# Patient Record
Sex: Female | Born: 1945 | Race: White | Hispanic: No | Marital: Married | Smoking: Former smoker
Health system: Southern US, Community
[De-identification: ages and names within clinical notes are randomized; demographics above are authoritative.]

## PROBLEM LIST (undated history)

## (undated) DIAGNOSIS — I10 Essential (primary) hypertension: Secondary | ICD-10-CM

## (undated) DIAGNOSIS — I8390 Asymptomatic varicose veins of unspecified lower extremity: Secondary | ICD-10-CM

## (undated) DIAGNOSIS — E785 Hyperlipidemia, unspecified: Secondary | ICD-10-CM

## (undated) DIAGNOSIS — T7840XA Allergy, unspecified, initial encounter: Secondary | ICD-10-CM

## (undated) HISTORY — DX: Asymptomatic varicose veins of unspecified lower extremity: I83.90

## (undated) HISTORY — PX: TONSILLECTOMY: SUR1361

## (undated) HISTORY — DX: Allergy, unspecified, initial encounter: T78.40XA

## (undated) HISTORY — DX: Hyperlipidemia, unspecified: E78.5

## (undated) HISTORY — PX: PITUITARY SURGERY: SHX203

## (undated) HISTORY — PX: JOINT REPLACEMENT: SHX530

## (undated) HISTORY — PX: VARICOSE VEIN SURGERY: SHX832

---

## 1998-02-05 ENCOUNTER — Other Ambulatory Visit: Admission: RE | Admit: 1998-02-05 | Discharge: 1998-02-05 | Payer: Self-pay | Admitting: *Deleted

## 2000-09-27 ENCOUNTER — Ambulatory Visit (HOSPITAL_COMMUNITY): Admission: RE | Admit: 2000-09-27 | Discharge: 2000-09-27 | Payer: Self-pay | Admitting: Internal Medicine

## 2000-09-27 ENCOUNTER — Encounter: Payer: Self-pay | Admitting: Internal Medicine

## 2000-10-22 ENCOUNTER — Other Ambulatory Visit: Admission: RE | Admit: 2000-10-22 | Discharge: 2000-10-22 | Payer: Self-pay | Admitting: Urology

## 2002-11-04 ENCOUNTER — Encounter: Payer: Self-pay | Admitting: Internal Medicine

## 2002-11-04 ENCOUNTER — Ambulatory Visit (HOSPITAL_COMMUNITY): Admission: RE | Admit: 2002-11-04 | Discharge: 2002-11-04 | Payer: Self-pay | Admitting: Internal Medicine

## 2004-03-16 ENCOUNTER — Ambulatory Visit (HOSPITAL_COMMUNITY): Admission: RE | Admit: 2004-03-16 | Discharge: 2004-03-16 | Payer: Self-pay | Admitting: Internal Medicine

## 2004-11-15 ENCOUNTER — Ambulatory Visit (HOSPITAL_COMMUNITY): Admission: RE | Admit: 2004-11-15 | Discharge: 2004-11-15 | Payer: Self-pay | Admitting: Internal Medicine

## 2005-08-09 ENCOUNTER — Ambulatory Visit (HOSPITAL_COMMUNITY): Admission: RE | Admit: 2005-08-09 | Discharge: 2005-08-09 | Payer: Self-pay

## 2006-04-24 ENCOUNTER — Encounter: Admission: RE | Admit: 2006-04-24 | Discharge: 2006-04-24 | Payer: Self-pay | Admitting: Internal Medicine

## 2006-11-07 ENCOUNTER — Encounter: Admission: RE | Admit: 2006-11-07 | Discharge: 2006-11-07 | Payer: Self-pay | Admitting: Internal Medicine

## 2007-05-03 ENCOUNTER — Encounter: Admission: RE | Admit: 2007-05-03 | Discharge: 2007-05-03 | Payer: Self-pay | Admitting: Emergency Medicine

## 2008-05-08 ENCOUNTER — Encounter: Admission: RE | Admit: 2008-05-08 | Discharge: 2008-05-08 | Payer: Self-pay | Admitting: Emergency Medicine

## 2009-05-18 ENCOUNTER — Encounter: Admission: RE | Admit: 2009-05-18 | Discharge: 2009-05-18 | Payer: Self-pay

## 2010-04-10 ENCOUNTER — Encounter: Payer: Self-pay | Admitting: Internal Medicine

## 2010-04-22 ENCOUNTER — Other Ambulatory Visit: Payer: Self-pay | Admitting: *Deleted

## 2010-04-22 DIAGNOSIS — Z1231 Encounter for screening mammogram for malignant neoplasm of breast: Secondary | ICD-10-CM

## 2010-05-05 ENCOUNTER — Other Ambulatory Visit (HOSPITAL_COMMUNITY): Payer: Self-pay | Admitting: Internal Medicine

## 2010-05-06 ENCOUNTER — Encounter (HOSPITAL_COMMUNITY): Payer: Self-pay

## 2010-05-06 ENCOUNTER — Ambulatory Visit (HOSPITAL_COMMUNITY)
Admission: RE | Admit: 2010-05-06 | Discharge: 2010-05-06 | Disposition: A | Discharge status: Home / Self Care | Payer: BC Managed Care – PPO | Source: Ambulatory Visit | Attending: Internal Medicine | Admitting: Internal Medicine

## 2010-05-06 DIAGNOSIS — R079 Chest pain, unspecified: Secondary | ICD-10-CM | POA: Insufficient documentation

## 2010-05-06 DIAGNOSIS — R0789 Other chest pain: Secondary | ICD-10-CM | POA: Insufficient documentation

## 2010-05-06 HISTORY — DX: Essential (primary) hypertension: I10

## 2010-05-06 MED ORDER — TECHNETIUM TC 99M TETROFOSMIN IV KIT: 20.0000 | PACK | Freq: Once | INTRAVENOUS | Status: DC | PRN

## 2010-05-06 MED ORDER — TECHNETIUM TC 99M TETROFOSMIN IV KIT
30.0000 | PACK | Freq: Once | INTRAVENOUS | Status: AC | PRN
Start: 2010-05-06 — End: 2010-05-06

## 2010-05-09 ENCOUNTER — Ambulatory Visit (HOSPITAL_COMMUNITY): Admission: RE | Admit: 2010-05-09 | Payer: Self-pay | Source: Ambulatory Visit

## 2010-05-10 MED ORDER — TECHNETIUM TC 99M TETROFOSMIN IV KIT
20.0000 | PACK | Freq: Once | INTRAVENOUS | Status: AC | PRN
  Administered 2010-05-10: 19.11 via INTRAVENOUS

## 2010-05-10 MED ORDER — TECHNETIUM TC 99M TETROFOSMIN IV KIT: 30.0000 | PACK | Freq: Once | INTRAVENOUS | Status: AC | PRN

## 2010-05-23 ENCOUNTER — Ambulatory Visit
Admission: RE | Admit: 2010-05-23 | Discharge: 2010-05-23 | Disposition: A | Discharge status: Home / Self Care | Payer: BC Managed Care – PPO | Source: Ambulatory Visit | Attending: *Deleted | Admitting: *Deleted

## 2010-05-23 ENCOUNTER — Other Ambulatory Visit: Payer: Self-pay | Admitting: Internal Medicine

## 2010-05-23 DIAGNOSIS — Z1231 Encounter for screening mammogram for malignant neoplasm of breast: Secondary | ICD-10-CM

## 2010-06-10 NOTE — Progress Notes (Signed)
  NAMESHAWNEEQUA, BALDRIDGE            ACCOUNT NO.:  [id number]  MEDICAL RECORD NO.:  [id number]           PATIENT TYPE:  LOCATION:                                 FACILITY:  PHYSICIAN:  Kingsley Callander. Ouida Sills, MD       DATE OF BIRTH:  [birth year]  DATE OF PROCEDURE: DATE OF DISCHARGE:                                PROGRESS NOTE   The patient underwent a Myoview stress test for evaluation of recent symptoms of chest pain.  She exercised 11 minutes and 13 seconds (2 minutes and 13 seconds in stage IV of the Bruce protocol) attaining a maximal heart rate of 175 (111% of the age predicted maximal heart rate), and a workload of 12.8 mets and discontinued exercise due to fatigue.  There were no symptoms of chest pain.  There were no arrhythmias.  There were no ST-segment changes diagnostic of ischemia. Her exercise tolerance was good.  Her baseline EKG revealed normal sinus rhythm at 71 beats per minute with nonspecific T-wave changes.  IMPRESSION:  No evidence of exercise-induced ischemia.  Myoview images are pending.     Kingsley Callander. Ouida Sills, MD     ROF/MEDQ  D:  05/06/2010  T:  05/06/2010  Job:  [id number]  Electronically Signed by Carylon Perches MD on 05/22/2010 11:24:08 PM

## 2010-06-30 ENCOUNTER — Encounter (HOSPITAL_BASED_OUTPATIENT_CLINIC_OR_DEPARTMENT_OTHER): Payer: BC Managed Care – PPO | Admitting: Internal Medicine

## 2010-06-30 ENCOUNTER — Ambulatory Visit (HOSPITAL_COMMUNITY)
Admission: RE | Admit: 2010-06-30 | Discharge: 2010-06-30 | Disposition: A | Discharge status: Home / Self Care | Payer: BC Managed Care – PPO | Source: Ambulatory Visit | Attending: Internal Medicine | Admitting: Internal Medicine

## 2010-06-30 ENCOUNTER — Ambulatory Visit (HOSPITAL_COMMUNITY): Payer: BC Managed Care – PPO

## 2010-06-30 DIAGNOSIS — Z5309 Procedure and treatment not carried out because of other contraindication: Secondary | ICD-10-CM | POA: Insufficient documentation

## 2010-06-30 DIAGNOSIS — Z1211 Encounter for screening for malignant neoplasm of colon: Secondary | ICD-10-CM

## 2010-06-30 DIAGNOSIS — E785 Hyperlipidemia, unspecified: Secondary | ICD-10-CM | POA: Insufficient documentation

## 2010-06-30 DIAGNOSIS — K6389 Other specified diseases of intestine: Secondary | ICD-10-CM | POA: Insufficient documentation

## 2010-06-30 DIAGNOSIS — Z79899 Other long term (current) drug therapy: Secondary | ICD-10-CM | POA: Insufficient documentation

## 2010-06-30 DIAGNOSIS — Q438 Other specified congenital malformations of intestine: Secondary | ICD-10-CM

## 2010-06-30 DIAGNOSIS — I10 Essential (primary) hypertension: Secondary | ICD-10-CM | POA: Insufficient documentation

## 2010-06-30 DIAGNOSIS — Z7982 Long term (current) use of aspirin: Secondary | ICD-10-CM | POA: Insufficient documentation

## 2010-07-01 ENCOUNTER — Ambulatory Visit (HOSPITAL_COMMUNITY): Payer: BC Managed Care – PPO

## 2010-07-15 ENCOUNTER — Other Ambulatory Visit (HOSPITAL_COMMUNITY): Payer: BC Managed Care – PPO

## 2010-07-25 NOTE — Op Note (Signed)
  Kristen Orr, Kristen Orr            ACCOUNT NO.:  [id number]  MEDICAL RECORD NO.:  [id number]           PATIENT TYPE:  O  LOCATION:  [facility]                          FACILITY:  [facility]  PHYSICIAN:  Lionel December, M.D.    DATE OF BIRTH:  02/22/1946  DATE OF PROCEDURE:  06/30/2010 DATE OF DISCHARGE:                              OPERATIVE REPORT   PROCEDURE:  Colonoscopy which was incomplete.  INDICATION:  The patient is a 65 year old Caucasian female who is undergoing average risk screening colonoscopy.  Her last exam was over 10 years ago.  Procedure risks were reviewed with the patient and informed consent was obtained.  MEDICATIONS FOR CONSCIOUS SEDATION: 1. Demerol 50 mg IV. 2. Versed 8 mg IV in divided dose.  FINDINGS:  Procedure performed in endoscopy suite.  The patient's vital signs and O2 sats were monitored during the procedure and remained stable.  The patient was placed in left lateral recumbent position and rectal examination performed.  No abnormality noted on external or digital exam.  Pentax videoscope was placed through rectum and advanced under vision into sigmoid colon.  In the sigmoid colon, she made either an 8 or a double loop.  Both of these loops to be palpated.  One of these loops in the left lower quadrant was completely reduced, but the one in the hypogastric area could not be completely reduced or prevented.  Using a loop, I was able to pass the scope into the mid transverse colon, but still had difficulty keeping the scope straight. Procedure was repeated by withdrawing the tip of the scope in the rectum and started again without any success.  Therefore, procedure was not complete.  While in the rectum, scope was retroflexed to examine anorectal junction and small hemorrhoids noted below the dentate line. The patient tolerated the procedure well.  FINAL DIAGNOSIS:  Incomplete colonoscopy limited midtransverse colon secondary to a double loop or 8 loop  formation in the sigmoid colon.  PLAN:  We will proceed with barium enema to complete her evaluation of lower GI tract.  Future colonoscopy would be under fluoroscopy.     Lionel December, M.D.     NR/MEDQ  D:  06/30/2010  T:  07/01/2010  Job:  [id number]  cc:   Kingsley Callander. Ouida Sills, MD Fax: [phone]  Electronically Signed by Lionel December M.D. on 07/25/2010 12:06:52 PM

## 2010-10-04 ENCOUNTER — Other Ambulatory Visit: Payer: Self-pay | Admitting: *Deleted

## 2010-10-04 DIAGNOSIS — M8580 Other specified disorders of bone density and structure, unspecified site: Secondary | ICD-10-CM

## 2010-10-17 ENCOUNTER — Ambulatory Visit
Admission: RE | Admit: 2010-10-17 | Discharge: 2010-10-17 | Disposition: A | Discharge status: Home / Self Care | Payer: BC Managed Care – PPO | Source: Ambulatory Visit | Attending: *Deleted | Admitting: *Deleted

## 2010-10-17 DIAGNOSIS — M8580 Other specified disorders of bone density and structure, unspecified site: Secondary | ICD-10-CM

## 2011-06-08 ENCOUNTER — Other Ambulatory Visit: Payer: Self-pay | Admitting: Internal Medicine

## 2011-06-08 DIAGNOSIS — Z1231 Encounter for screening mammogram for malignant neoplasm of breast: Secondary | ICD-10-CM

## 2011-06-22 ENCOUNTER — Ambulatory Visit
Admission: RE | Admit: 2011-06-22 | Discharge: 2011-06-22 | Disposition: A | Discharge status: Home / Self Care | Payer: BC Managed Care – PPO | Source: Ambulatory Visit | Attending: Internal Medicine | Admitting: Internal Medicine

## 2011-06-22 DIAGNOSIS — Z1231 Encounter for screening mammogram for malignant neoplasm of breast: Secondary | ICD-10-CM

## 2011-11-14 DIAGNOSIS — Z79890 Hormone replacement therapy (postmenopausal): Secondary | ICD-10-CM | POA: Insufficient documentation

## 2011-11-14 DIAGNOSIS — M8580 Other specified disorders of bone density and structure, unspecified site: Secondary | ICD-10-CM | POA: Insufficient documentation

## 2012-03-19 ENCOUNTER — Other Ambulatory Visit: Payer: Self-pay | Admitting: *Deleted

## 2012-03-19 DIAGNOSIS — I83893 Varicose veins of bilateral lower extremities with other complications: Secondary | ICD-10-CM

## 2012-04-26 ENCOUNTER — Encounter: Payer: Self-pay | Admitting: Vascular Surgery

## 2012-04-29 ENCOUNTER — Ambulatory Visit (INDEPENDENT_AMBULATORY_CARE_PROVIDER_SITE_OTHER): Payer: BC Managed Care – PPO | Admitting: Vascular Surgery

## 2012-04-29 ENCOUNTER — Encounter: Payer: Self-pay | Admitting: Vascular Surgery

## 2012-04-29 VITALS — BP 125/79 | HR 85 | Resp 18 | Ht 65.0 in | Wt 135.0 lb

## 2012-04-29 DIAGNOSIS — I83893 Varicose veins of bilateral lower extremities with other complications: Secondary | ICD-10-CM

## 2012-04-29 DIAGNOSIS — M79609 Pain in unspecified limb: Secondary | ICD-10-CM

## 2012-04-29 NOTE — Progress Notes (Signed)
Subjective:     Patient ID: Kristen Orr, female   DOB: [birth year], 67 y.o.   MRN: [id number]  HPIthis 67 year old female was referred by Dr. Ouida Sills for evaluation of varicose veins in both lower extremities. Patient has been treated previously in [place] by Dr. Earnestine Leys no longer practices there. The patient had laser ablation of bilateral great saphenous veins about 1-2 years ago and sclerotherapy. She had a good early results but has had recurrent aching throbbing and burning discomfort in both calves as the day progresses. She has cramping in her legs at night a different sensation during the daytime which is worse while she is standing or sitting. She does wear short-leg impression stockings which improve her symptoms. She has noticed some bluish discoloration in the feet as the day progresses. She has no history of DVT, thrombophlebitis, pulmonary embolus, stasis ulcers, or bleeding.  Past Medical History  Diagnosis Date  . Hypertension   . Varicose veins   . Hyperlipidemia     History  Substance Use Topics  . Smoking status: Former Smoker    Quit date: 04/29/1976  . Smokeless tobacco: Never Used  . Alcohol Use: Yes    Family History  Problem Relation Age of Onset  . Cancer Mother   . Hyperlipidemia Mother   . Hypertension Mother   . Cancer Father   . Deep vein thrombosis Sister     Allergies  Allergen Reactions  . Penicillins     Current outpatient prescriptions:citalopram (CELEXA) 20 MG tablet, Take 20 mg by mouth daily., Disp: , Rfl: ;  clonazePAM (KLONOPIN) 0.5 MG tablet, Take 0.5 mg by mouth daily., Disp: , Rfl: ;  estrogen, conjugated,-medroxyprogesterone (PREMPRO) 0.3-1.5 MG per tablet, Take 1 tablet by mouth 2 (two) times a week., Disp: , Rfl: ;  Fexofenadine HCl (ALLEGRA PO), Take by mouth., Disp: , Rfl:  Omeprazole (PRILOSEC PO), Take by mouth daily., Disp: , Rfl: ;  simvastatin (ZOCOR) 20 MG tablet, Take 20 mg by mouth every evening., Disp: , Rfl: ;   telmisartan-hydrochlorothiazide (MICARDIS HCT) 40-12.5 MG per tablet, Take 1 tablet by mouth daily., Disp: , Rfl:   BP 125/79  Pulse 85  Resp 18  Ht 5\' 5"  (1.651 m)  Wt 135 lb (61.236 kg)  BMI 22.47 kg/m2  Body mass index is 22.47 kg/(m^2).         Review of Systemsdenies chest pain, dyspnea on exertion, PND, orthopnea, hemoptysis, of claudication. All systems negative     Objective:   Physical Exam blood pressure 120/79 heart rate 85 respirations 18 Gen.-alert and oriented x3 in no apparent distress HEENT normal for age Lungs no rhonchi or wheezing Cardiovascular regular rhythm no murmurs carotid pulses 3+ palpable no bruits audible Abdomen soft nontender no palpable masses Musculoskeletal free of  major deformities Skin clear -no rashes Neurologic normal Lower extremities 3+ femoral and dorsalis pedis pulses palpable bilaterally with no edema Left leg with prominent reticular veins in one thrombosed varicose vein in the popliteal fossa and proximal calf with no distal edema, hyperpigmentation, or ulceration. Right leg has some prominent spider and reticular veins in the distal thigh posteriorly and laterally with no hyperpigmentation or ulceration or bulging varicosities noted.  Today I ordered bilateral venous duplex exam which I reviewed and interpreted. Both great saphenous veins have been totally ablated. There is some reflux in the small saphenous veins bilaterally with the veins being small caliber. There is deep venous reflux bilaterally.  Assessment:     Bilateral venous insufficiency with deep venous reflux-post laser ablation bilateral great saphenous veins Prominent reticular veins left posterior calf and popliteal fossa-possibly symptomatic     Plan:     #1 short leg elastic compression stockings 20-30 mm gradient #2 elevate legs at night #3 Will offer possible sclerotherapy of these particular and spider veins and left calf if patient is  interested-this may relieve some of her symptomatology because of the location

## 2012-05-28 ENCOUNTER — Encounter: Payer: Self-pay | Admitting: *Deleted

## 2012-05-29 ENCOUNTER — Ambulatory Visit (INDEPENDENT_AMBULATORY_CARE_PROVIDER_SITE_OTHER): Payer: BC Managed Care – PPO | Admitting: *Deleted

## 2012-05-29 DIAGNOSIS — I781 Nevus, non-neoplastic: Secondary | ICD-10-CM

## 2012-05-29 NOTE — Progress Notes (Signed)
X=.3% Sotradecol administered with a 27g butterfly.  Patient received a total of 12cc.  Reticulars treated first. Easy access. tol well. Hoping for good result. Treated other smaller spiders too. Will follow this nice lady prn.  Photos: yes  Compression stockings applied: yes

## 2012-05-30 ENCOUNTER — Encounter: Payer: Self-pay | Admitting: Vascular Surgery

## 2012-12-09 DIAGNOSIS — I789 Disease of capillaries, unspecified: Secondary | ICD-10-CM | POA: Diagnosis not present

## 2012-12-09 DIAGNOSIS — L821 Other seborrheic keratosis: Secondary | ICD-10-CM | POA: Diagnosis not present

## 2012-12-31 DIAGNOSIS — E785 Hyperlipidemia, unspecified: Secondary | ICD-10-CM | POA: Diagnosis not present

## 2013-01-07 DIAGNOSIS — N390 Urinary tract infection, site not specified: Secondary | ICD-10-CM | POA: Diagnosis not present

## 2013-01-07 DIAGNOSIS — E785 Hyperlipidemia, unspecified: Secondary | ICD-10-CM | POA: Diagnosis not present

## 2013-01-07 DIAGNOSIS — I10 Essential (primary) hypertension: Secondary | ICD-10-CM | POA: Diagnosis not present

## 2013-01-07 DIAGNOSIS — Z23 Encounter for immunization: Secondary | ICD-10-CM | POA: Diagnosis not present

## 2013-02-10 DIAGNOSIS — Z1211 Encounter for screening for malignant neoplasm of colon: Secondary | ICD-10-CM | POA: Diagnosis not present

## 2013-02-10 DIAGNOSIS — Z124 Encounter for screening for malignant neoplasm of cervix: Secondary | ICD-10-CM | POA: Diagnosis not present

## 2013-02-10 DIAGNOSIS — Z01419 Encounter for gynecological examination (general) (routine) without abnormal findings: Secondary | ICD-10-CM | POA: Diagnosis not present

## 2013-02-24 ENCOUNTER — Telehealth: Payer: Self-pay | Admitting: Vascular Surgery

## 2013-02-24 NOTE — Telephone Encounter (Addendum)
Message copied by Fredrich Birks on Mon Feb 24, 2013  1:46 PM ------      Message from: Micki Riley      Created: Mon Feb 24, 2013  8:32 AM      Regarding: RE: JDL visit?       Ok..I talked to her. She will need a study of her left leg before seeing JDL. I told her you would look for a time for the study and JDL together. This doesn't sound like and emergency btw. Thx!      ----- Message -----         From: Fredrich Birks         Sent: 02/21/2013   1:35 PM           To: Micki Riley, RN      Subject: JDL visit?                                               Kristen Orr, Kristen Orr called to make an appointment for a VV issue in her left leg. She was here in February 2014 for venous reflux and consult. She ended up having sclero and was prescribed the compression stockings. I just scheduled her to see JDL in follow up for 03/18/13. Does she need repeat study on the left leg?            Thanks,      Annabelle Harman       ------  02/24/13: spoke with pt to schedule time for lab before seeing JDL, dpm

## 2013-03-12 ENCOUNTER — Other Ambulatory Visit: Payer: Self-pay | Admitting: *Deleted

## 2013-03-12 DIAGNOSIS — I83893 Varicose veins of bilateral lower extremities with other complications: Secondary | ICD-10-CM

## 2013-03-17 ENCOUNTER — Encounter: Payer: Self-pay | Admitting: Vascular Surgery

## 2013-03-18 ENCOUNTER — Encounter: Payer: Self-pay | Admitting: Vascular Surgery

## 2013-03-18 ENCOUNTER — Encounter (INDEPENDENT_AMBULATORY_CARE_PROVIDER_SITE_OTHER): Payer: Self-pay

## 2013-03-18 ENCOUNTER — Ambulatory Visit (HOSPITAL_COMMUNITY)
Admission: RE | Admit: 2013-03-18 | Discharge: 2013-03-18 | Disposition: A | Discharge status: Home / Self Care | Payer: Medicare Other | Source: Ambulatory Visit | Attending: Vascular Surgery | Admitting: Vascular Surgery

## 2013-03-18 ENCOUNTER — Ambulatory Visit (INDEPENDENT_AMBULATORY_CARE_PROVIDER_SITE_OTHER): Payer: Medicare Other | Admitting: Vascular Surgery

## 2013-03-18 VITALS — BP 122/75 | HR 71 | Resp 16 | Ht 65.5 in | Wt 132.0 lb

## 2013-03-18 DIAGNOSIS — I83893 Varicose veins of bilateral lower extremities with other complications: Secondary | ICD-10-CM | POA: Diagnosis not present

## 2013-03-18 NOTE — Progress Notes (Signed)
Subjective:     Patient ID: Kristen Orr, female   DOB: 1945-10-18, 67 y.o.   MRN: [id number]  HPI this 67 year old female returns with increasing discomfort in her left posterior calf area due to bulging varicosities. I previously evaluated her several months ago. She had had bilateral great saphenous vein ablations performed in [place]. She required some sclerotherapy for some reticular and spider veins several months ago. Since that time she has developed some large bulging varicosities in the left popliteal fossa which cause aching throbbing and burning discomfort. She does have some edema in the ankle as the day progresses. She has tried long-leg elastic compression stockings as well as elevation and ibuprofen with no improvement in her symptomatology. She has no history of DVT bleeding thrombophlebitis or stasis ulcers.  Past Medical History  Diagnosis Date  . Hypertension   . Varicose veins   . Hyperlipidemia     History  Substance Use Topics  . Smoking status: Former Smoker    Quit date: 04/29/1976  . Smokeless tobacco: Never Used  . Alcohol Use: Yes    Family History  Problem Relation Age of Onset  . Cancer Mother   . Hyperlipidemia Mother   . Hypertension Mother   . Cancer Father   . Deep vein thrombosis Sister     Allergies  Allergen Reactions  . Penicillins     Current outpatient prescriptions:citalopram (CELEXA) 20 MG tablet, Take 20 mg by mouth daily., Disp: , Rfl: ;  clonazePAM (KLONOPIN) 0.5 MG tablet, Take 0.5 mg by mouth daily., Disp: , Rfl: ;  Fexofenadine HCl (ALLEGRA PO), Take by mouth., Disp: , Rfl: ;  telmisartan-hydrochlorothiazide (MICARDIS HCT) 40-12.5 MG per tablet, Take 1 tablet by mouth daily., Disp: , Rfl:  estrogen, conjugated,-medroxyprogesterone (PREMPRO) 0.3-1.5 MG per tablet, Take 1 tablet by mouth 2 (two) times a week., Disp: , Rfl: ;  Omeprazole (PRILOSEC PO), Take by mouth daily., Disp: , Rfl: ;  simvastatin (ZOCOR) 20 MG tablet, Take 20  mg by mouth every evening., Disp: , Rfl:   BP 122/75  Pulse 71  Resp 16  Ht 5' 5.5" (1.664 m)  Wt 132 lb (59.875 kg)  BMI 21.62 kg/m2  Body mass index is 21.62 kg/(m^2).           Review of Systems denies chest pain, dyspnea on exertion, PND, orthopnea, hemoptysis     Objective:   Physical Exam BP 122/75  Pulse 71  Resp 16  Ht 5' 5.5" (1.664 m)  Wt 132 lb (59.875 kg)  BMI 21.62 kg/m2  General well-developed well-nourished female no apparent stress alert and oriented x3 Lungs no rhonchi or wheezing Left leg with prominent bulging varicosity running transversely in the popliteal crease and a few smaller varicosities more distally located. 1+ chronic edema. 3+ dorsalis pedis pulse palpable.  Today I ordered a venous duplex exam of the left leg which are reviewed and interpreted. Left great saphenous vein has been ablated. Left small saphenous vein has enlarged significantly since her last study several months ago and now has reflux throughout supplying this bulging varix in the popliteal fossa     Assessment:     Gross reflux left small saphenous vein with symptomatically bulging varicosity-resistant to conservative measures including long-leg elastic compression stockings 20-30 mm gradient, elevation, and ibuprofen.    Plan:     Patient needs a laser ablation left small saphenous vein and then to return in 3 months to see if stab phlebectomy of this residual varix  will be indicated

## 2013-03-24 ENCOUNTER — Other Ambulatory Visit: Payer: Self-pay | Admitting: *Deleted

## 2013-03-24 DIAGNOSIS — I83893 Varicose veins of bilateral lower extremities with other complications: Secondary | ICD-10-CM

## 2013-04-11 ENCOUNTER — Encounter: Payer: Self-pay | Admitting: Vascular Surgery

## 2013-04-14 ENCOUNTER — Encounter: Payer: Self-pay | Admitting: Vascular Surgery

## 2013-04-14 ENCOUNTER — Ambulatory Visit (INDEPENDENT_AMBULATORY_CARE_PROVIDER_SITE_OTHER): Payer: Medicare Other | Admitting: Vascular Surgery

## 2013-04-14 VITALS — BP 134/84 | HR 79 | Resp 16 | Ht 65.5 in | Wt 132.0 lb

## 2013-04-14 DIAGNOSIS — I83893 Varicose veins of bilateral lower extremities with other complications: Secondary | ICD-10-CM

## 2013-04-14 NOTE — Progress Notes (Signed)
Subjective:     Patient ID: Kristen Orr, female   DOB: [birth year], 68 y.o.   MRN: [id number]  HPI this 68 year old female had laser ablation of the left small saphenous vein performed under local tumescent anesthesia for venous hypertension and recurrent varicosities in the left popliteal fossa. Patient had previously undergone laser ablation bilateral great saphenous veins at another location. The vein did not communicate with the popliteal vein that extended laterally into the distal thigh. This was performed under local tumescent anesthesia and she tolerated it well. She also had sclerotherapy of one prominent varicosity in this area.  Review of Systems     Objective:   Physical Exam BP 134/84  Pulse 79  Resp 16  Ht 5' 5.5" (1.664 m)  Wt 132 lb (59.875 kg)  BMI 21.62 kg/m2       Assessment:     Well-tolerated laser ablation left small saphenous vein plus sclerotherapy a prominent varicosity performed under local tumescent anesthesia    Plan:     Return in one week for venous duplex exam to confirm closure left small saphenous

## 2013-04-14 NOTE — Progress Notes (Signed)
   Laser Ablation Procedure      Date: 04/14/2013    Kristen Orr DOB:1945/08/15  Consent signed: Yes  Surgeon:J.D. Kellie Simmering  Procedure: Laser Ablation: left Small Saphenous Vein  BP 134/84  Pulse 79  Resp 16  Ht 5' 5.5" (1.664 m)  Wt 132 lb (59.875 kg)  BMI 21.62 kg/m2  Start time: 2:55   End time: 3:50  Tumescent Anesthesia: 200 cc 0.9% NaCl with 50 cc Lidocaine HCL with 1% Epi and 15 cc 8.4% NaHCO3  Local Anesthesia: 4 cc Lidocaine HCL and NaHCO3 (ratio 2:1)  Pulsed mode: 15 watts, 500 ms delay, 1.o duration Total energy: 748, total pulses:50, total time: :50   Sclerotherapy: .5 %Asclera . Patient received a total of 6 cc    Patient tolerated procedure well: Yes  Notes:   Description of Procedure:  After marking the course of the saphenous vein and the secondary varicosities in the standing position, the patient was placed on the operating table in the prone position, and the left leg was prepped and draped in sterile fashion. Local anesthetic was administered, and under ultrasound guidance the saphenous vein was accessed with a micro needle and guide wire; then the micro puncture sheath was placed. A guide wire was inserted to the saphenopopliteal junction, followed by a 5 french sheath.  The position of the sheath and then the laser fiber below the junction was confirmed using the ultrasound and visualization of the aiming beam.  Tumescent anesthesia was administered along the course of the saphenous vein using ultrasound guidance. Protective laser glasses were placed on the patient, and the laser was fired at 15 watt pulsed mode advancing 1-2 mm per sec.  For a total of 748 joules.  A steri strip was applied to the puncture site.     Sclerotherapy was performed to buldging vessels using .5%  Asclera foam via a 27g butterfly needle.  ABD pads and thigh high compression stockings were applied.  Ace wrap bandages were applied over the phlebectomy sites and at the top of  the saphenopopliteal junction.  Blood loss was less than 15 cc.  The patient ambulated out of the operating room having tolerated the procedure well.

## 2013-04-15 ENCOUNTER — Telehealth: Payer: Self-pay | Admitting: *Deleted

## 2013-04-15 NOTE — Telephone Encounter (Signed)
Patient doing well and not having much pain. Following all instructions. Reminded her of her fu appt next monday.

## 2013-04-18 ENCOUNTER — Encounter: Payer: Self-pay | Admitting: Vascular Surgery

## 2013-04-21 ENCOUNTER — Other Ambulatory Visit: Payer: Self-pay | Admitting: Vascular Surgery

## 2013-04-21 ENCOUNTER — Encounter: Payer: Self-pay | Admitting: Vascular Surgery

## 2013-04-21 ENCOUNTER — Ambulatory Visit (INDEPENDENT_AMBULATORY_CARE_PROVIDER_SITE_OTHER): Payer: Self-pay | Admitting: Vascular Surgery

## 2013-04-21 ENCOUNTER — Ambulatory Visit (HOSPITAL_COMMUNITY)
Admission: RE | Admit: 2013-04-21 | Discharge: 2013-04-21 | Disposition: A | Discharge status: Home / Self Care | Payer: Medicare Other | Source: Ambulatory Visit | Attending: Vascular Surgery | Admitting: Vascular Surgery

## 2013-04-21 VITALS — BP 129/85 | HR 75 | Resp 16 | Ht 65.5 in | Wt 132.0 lb

## 2013-04-21 DIAGNOSIS — I83893 Varicose veins of bilateral lower extremities with other complications: Secondary | ICD-10-CM

## 2013-04-21 DIAGNOSIS — Z09 Encounter for follow-up examination after completed treatment for conditions other than malignant neoplasm: Secondary | ICD-10-CM | POA: Insufficient documentation

## 2013-04-21 NOTE — Progress Notes (Signed)
Subjective:     Patient ID: Kristen Orr, female   DOB: 25-Jan-1946, 68 y.o.   MRN: [id number]  HPI this 68 year old female returns 1 week post laser ablation of the left small saphenous vein with sclerotherapy of a prominent varix in the popliteal fossa. She has had mild discomfort. She has been wearing along with elastic compression stocking as instructed. She's also taken ibuprofen.  Past Medical History  Diagnosis Date  . Hypertension   . Varicose veins   . Hyperlipidemia     History  Substance Use Topics  . Smoking status: Former Smoker    Quit date: 04/29/1976  . Smokeless tobacco: Never Used  . Alcohol Use: Yes    Family History  Problem Relation Age of Onset  . Cancer Mother   . Hyperlipidemia Mother   . Hypertension Mother   . Cancer Father   . Deep vein thrombosis Sister     Allergies  Allergen Reactions  . Penicillins     Current outpatient prescriptions:citalopram (CELEXA) 20 MG tablet, Take 20 mg by mouth daily., Disp: , Rfl: ;  clonazePAM (KLONOPIN) 0.5 MG tablet, Take 0.5 mg by mouth daily., Disp: , Rfl: ;  estrogen, conjugated,-medroxyprogesterone (PREMPRO) 0.3-1.5 MG per tablet, Take 1 tablet by mouth 2 (two) times a week., Disp: , Rfl: ;  Fexofenadine HCl (ALLEGRA PO), Take by mouth., Disp: , Rfl:  Omeprazole (PRILOSEC PO), Take by mouth daily., Disp: , Rfl: ;  simvastatin (ZOCOR) 20 MG tablet, Take 20 mg by mouth every evening., Disp: , Rfl: ;  telmisartan-hydrochlorothiazide (MICARDIS HCT) 40-12.5 MG per tablet, Take 1 tablet by mouth daily., Disp: , Rfl:   BP 129/85  Pulse 75  Resp 16  Ht 5' 5.5" (1.664 m)  Wt 132 lb (59.875 kg)  BMI 21.62 kg/m2  Body mass index is 21.62 kg/(m^2).           Review of Systems denies chest pain, dyspnea on exertion, PND, orthopnea, hemoptysis    Objective:   Physical Exam BP 129/85  Pulse 75  Resp 16  Ht 5' 5.5" (1.664 m)  Wt 132 lb (59.875 kg)  BMI 21.62 kg/m2  General well-developed  well-nourished female no apparent distress alert and oriented x3 Lungs no rhonchi or wheezing Left leg with mild tenderness along the course of small saphenous vein and popliteal area. No distal edema noted. 3 posterior cells pedis pulse palpable.  Data ordered venous duplex exam the left leg which are reviewed and interpreted. There is no DVT. The left small saphenous vein is closed and continues as the vein ofGiacomini which is occluded      Assessment:     Successful laser ablation left small saphenous vein with sclerotherapy of prominent varix    Plan:     Will return to see [place] on a when necessary basis

## 2013-06-09 DIAGNOSIS — L821 Other seborrheic keratosis: Secondary | ICD-10-CM | POA: Diagnosis not present

## 2013-06-09 DIAGNOSIS — L570 Actinic keratosis: Secondary | ICD-10-CM | POA: Diagnosis not present

## 2013-06-09 DIAGNOSIS — D239 Other benign neoplasm of skin, unspecified: Secondary | ICD-10-CM | POA: Diagnosis not present

## 2013-06-09 DIAGNOSIS — D1801 Hemangioma of skin and subcutaneous tissue: Secondary | ICD-10-CM | POA: Diagnosis not present

## 2013-07-09 DIAGNOSIS — D352 Benign neoplasm of pituitary gland: Secondary | ICD-10-CM | POA: Diagnosis not present

## 2013-07-09 DIAGNOSIS — I10 Essential (primary) hypertension: Secondary | ICD-10-CM | POA: Diagnosis not present

## 2013-07-09 DIAGNOSIS — D353 Benign neoplasm of craniopharyngeal duct: Secondary | ICD-10-CM | POA: Diagnosis not present

## 2013-07-09 DIAGNOSIS — Z79899 Other long term (current) drug therapy: Secondary | ICD-10-CM | POA: Diagnosis not present

## 2013-07-09 DIAGNOSIS — E785 Hyperlipidemia, unspecified: Secondary | ICD-10-CM | POA: Diagnosis not present

## 2013-07-17 ENCOUNTER — Other Ambulatory Visit (HOSPITAL_COMMUNITY): Payer: Self-pay | Admitting: Internal Medicine

## 2013-07-17 DIAGNOSIS — I10 Essential (primary) hypertension: Secondary | ICD-10-CM | POA: Diagnosis not present

## 2013-07-17 DIAGNOSIS — E785 Hyperlipidemia, unspecified: Secondary | ICD-10-CM | POA: Diagnosis not present

## 2013-07-17 DIAGNOSIS — Z1231 Encounter for screening mammogram for malignant neoplasm of breast: Secondary | ICD-10-CM

## 2013-07-22 ENCOUNTER — Ambulatory Visit (HOSPITAL_COMMUNITY)
Admission: RE | Admit: 2013-07-22 | Discharge: 2013-07-22 | Disposition: A | Discharge status: Home / Self Care | Payer: Medicare Other | Source: Ambulatory Visit | Attending: Internal Medicine | Admitting: Internal Medicine

## 2013-07-22 DIAGNOSIS — Z1231 Encounter for screening mammogram for malignant neoplasm of breast: Secondary | ICD-10-CM | POA: Insufficient documentation

## 2013-07-22 DIAGNOSIS — R928 Other abnormal and inconclusive findings on diagnostic imaging of breast: Secondary | ICD-10-CM | POA: Diagnosis not present

## 2013-07-28 ENCOUNTER — Other Ambulatory Visit: Payer: Self-pay | Admitting: Internal Medicine

## 2013-07-28 DIAGNOSIS — R928 Other abnormal and inconclusive findings on diagnostic imaging of breast: Secondary | ICD-10-CM

## 2013-08-13 ENCOUNTER — Ambulatory Visit (HOSPITAL_COMMUNITY)
Admission: RE | Admit: 2013-08-13 | Discharge: 2013-08-13 | Disposition: A | Discharge status: Home / Self Care | Payer: Medicare Other | Source: Ambulatory Visit | Attending: Internal Medicine | Admitting: Internal Medicine

## 2013-08-13 DIAGNOSIS — R922 Inconclusive mammogram: Secondary | ICD-10-CM | POA: Diagnosis not present

## 2013-08-13 DIAGNOSIS — R928 Other abnormal and inconclusive findings on diagnostic imaging of breast: Secondary | ICD-10-CM

## 2013-11-17 DIAGNOSIS — H109 Unspecified conjunctivitis: Secondary | ICD-10-CM | POA: Diagnosis not present

## 2013-12-18 DIAGNOSIS — H2511 Age-related nuclear cataract, right eye: Secondary | ICD-10-CM | POA: Diagnosis not present

## 2013-12-18 DIAGNOSIS — H5213 Myopia, bilateral: Secondary | ICD-10-CM | POA: Diagnosis not present

## 2013-12-18 DIAGNOSIS — H43813 Vitreous degeneration, bilateral: Secondary | ICD-10-CM | POA: Diagnosis not present

## 2013-12-18 DIAGNOSIS — H25812 Combined forms of age-related cataract, left eye: Secondary | ICD-10-CM | POA: Diagnosis not present

## 2014-01-12 DIAGNOSIS — L820 Inflamed seborrheic keratosis: Secondary | ICD-10-CM | POA: Diagnosis not present

## 2014-01-12 DIAGNOSIS — L821 Other seborrheic keratosis: Secondary | ICD-10-CM | POA: Diagnosis not present

## 2014-01-12 DIAGNOSIS — I788 Other diseases of capillaries: Secondary | ICD-10-CM | POA: Diagnosis not present

## 2014-01-15 DIAGNOSIS — Z23 Encounter for immunization: Secondary | ICD-10-CM | POA: Diagnosis not present

## 2014-01-15 DIAGNOSIS — I10 Essential (primary) hypertension: Secondary | ICD-10-CM | POA: Diagnosis not present

## 2014-01-15 DIAGNOSIS — E875 Hyperkalemia: Secondary | ICD-10-CM | POA: Diagnosis not present

## 2014-03-03 ENCOUNTER — Encounter (HOSPITAL_COMMUNITY): Payer: Self-pay | Admitting: *Deleted

## 2014-03-03 ENCOUNTER — Emergency Department (HOSPITAL_COMMUNITY)
Admission: EM | Admit: 2014-03-03 | Discharge: 2014-03-03 | Disposition: A | Discharge status: Home / Self Care | Payer: Medicare Other | Attending: Emergency Medicine | Admitting: Emergency Medicine

## 2014-03-03 DIAGNOSIS — T23101A Burn of first degree of right hand, unspecified site, initial encounter: Secondary | ICD-10-CM

## 2014-03-03 DIAGNOSIS — Y9289 Other specified places as the place of occurrence of the external cause: Secondary | ICD-10-CM | POA: Insufficient documentation

## 2014-03-03 DIAGNOSIS — I10 Essential (primary) hypertension: Secondary | ICD-10-CM | POA: Insufficient documentation

## 2014-03-03 DIAGNOSIS — X12XXXA Contact with other hot fluids, initial encounter: Secondary | ICD-10-CM | POA: Diagnosis not present

## 2014-03-03 DIAGNOSIS — Z87891 Personal history of nicotine dependence: Secondary | ICD-10-CM | POA: Diagnosis not present

## 2014-03-03 DIAGNOSIS — Z880 Allergy status to penicillin: Secondary | ICD-10-CM | POA: Insufficient documentation

## 2014-03-03 DIAGNOSIS — T23102A Burn of first degree of left hand, unspecified site, initial encounter: Secondary | ICD-10-CM

## 2014-03-03 DIAGNOSIS — Z8679 Personal history of other diseases of the circulatory system: Secondary | ICD-10-CM | POA: Insufficient documentation

## 2014-03-03 DIAGNOSIS — T310 Burns involving less than 10% of body surface: Secondary | ICD-10-CM | POA: Diagnosis not present

## 2014-03-03 DIAGNOSIS — Z79899 Other long term (current) drug therapy: Secondary | ICD-10-CM | POA: Diagnosis not present

## 2014-03-03 DIAGNOSIS — Y9389 Activity, other specified: Secondary | ICD-10-CM | POA: Insufficient documentation

## 2014-03-03 DIAGNOSIS — Y998 Other external cause status: Secondary | ICD-10-CM | POA: Insufficient documentation

## 2014-03-03 DIAGNOSIS — Z23 Encounter for immunization: Secondary | ICD-10-CM | POA: Diagnosis not present

## 2014-03-03 DIAGNOSIS — Z8639 Personal history of other endocrine, nutritional and metabolic disease: Secondary | ICD-10-CM | POA: Insufficient documentation

## 2014-03-03 MED ORDER — SILVER SULFADIAZINE 1 % EX CREA: 1.0000 "application " | TOPICAL_CREAM | Freq: Every day | CUTANEOUS | Status: DC

## 2014-03-03 MED ORDER — HYDROMORPHONE HCL 1 MG/ML IJ SOLN
1.0000 mg | Freq: Once | INTRAMUSCULAR | Status: AC
  Administered 2014-03-03: 1 mg via INTRAMUSCULAR
  Filled 2014-03-03: qty 1

## 2014-03-03 MED ORDER — OXYCODONE-ACETAMINOPHEN 5-325 MG PO TABS: 1.0000 | ORAL_TABLET | ORAL | Status: DC | PRN

## 2014-03-03 MED ORDER — OXYCODONE-ACETAMINOPHEN 5-325 MG PO TABS
2.0000 | ORAL_TABLET | Freq: Once | ORAL | Status: AC
  Administered 2014-03-03: 2 via ORAL
  Filled 2014-03-03: qty 2

## 2014-03-03 MED ORDER — TETANUS-DIPHTH-ACELL PERTUSSIS 5-2.5-18.5 LF-MCG/0.5 IM SUSP
0.5000 mL | Freq: Once | INTRAMUSCULAR | Status: AC
  Administered 2014-03-03: 0.5 mL via INTRAMUSCULAR
  Filled 2014-03-03: qty 0.5

## 2014-03-03 NOTE — ED Notes (Signed)
Patient c/o pain, EDP aware. 
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
## 2014-03-03 NOTE — Discharge Instructions (Signed)
Burn Care Your skin is a natural barrier to infection. It is the largest organ of your body. Burns damage this natural protection. To help prevent infection, it is very important to follow your caregiver's instructions in the care of your burn. Burns are classified as:  First degree. There is only redness of the skin (erythema). No scarring is expected.  Second degree. There is blistering of the skin. Scarring may occur with deeper burns.  Third degree. All layers of the skin are injured, and scarring is expected. HOME CARE INSTRUCTIONS   Wash your hands well before changing your bandage.  Change your bandage as often as directed by your caregiver.  Remove the old bandage. If the bandage sticks, you may soak it off with cool, clean water.  Cleanse the burn thoroughly but gently with mild soap and water.  Pat the area dry with a clean, dry cloth.  Apply a thin layer of antibacterial cream to the burn.  Apply a clean bandage as instructed by your caregiver.  Keep the bandage as clean and dry as possible.  Elevate the affected area for the first 24 hours, then as instructed by your caregiver.  Only take over-the-counter or prescription medicines for pain, discomfort, or fever as directed by your caregiver. SEEK IMMEDIATE MEDICAL CARE IF:   You develop excessive pain.  You develop redness, tenderness, swelling, or red streaks near the burn.  The burned area develops yellowish-white fluid (pus) or a bad smell.  You have a fever. MAKE SURE YOU:   Understand these instructions.  Will watch your condition.  Will get help right away if you are not doing well or get worse. Document Released: 03/06/2005 Document Revised: 05/29/2011 Document Reviewed: 07/27/2010 ExitCare Patient Information 2015 [employer]. This information is not intended to replace advice given to you by your health care provider. Make sure you discuss any questions you have with your health care  provider.  

## 2014-03-27 DIAGNOSIS — T23101A Burn of first degree of right hand, unspecified site, initial encounter: Secondary | ICD-10-CM | POA: Insufficient documentation

## 2014-04-21 DIAGNOSIS — Z719 Counseling, unspecified: Secondary | ICD-10-CM | POA: Insufficient documentation

## 2014-04-28 DIAGNOSIS — L814 Other melanin hyperpigmentation: Secondary | ICD-10-CM | POA: Diagnosis not present

## 2014-04-28 DIAGNOSIS — L905 Scar conditions and fibrosis of skin: Secondary | ICD-10-CM | POA: Diagnosis not present

## 2014-04-28 DIAGNOSIS — L738 Other specified follicular disorders: Secondary | ICD-10-CM | POA: Diagnosis not present

## 2014-04-28 DIAGNOSIS — L820 Inflamed seborrheic keratosis: Secondary | ICD-10-CM | POA: Diagnosis not present

## 2014-04-28 DIAGNOSIS — L720 Epidermal cyst: Secondary | ICD-10-CM | POA: Diagnosis not present

## 2014-04-28 DIAGNOSIS — D485 Neoplasm of uncertain behavior of skin: Secondary | ICD-10-CM | POA: Diagnosis not present

## 2014-04-28 DIAGNOSIS — L821 Other seborrheic keratosis: Secondary | ICD-10-CM | POA: Diagnosis not present

## 2014-07-01 DIAGNOSIS — H04211 Epiphora due to excess lacrimation, right lacrimal gland: Secondary | ICD-10-CM | POA: Diagnosis not present

## 2014-07-07 DIAGNOSIS — I10 Essential (primary) hypertension: Secondary | ICD-10-CM | POA: Diagnosis not present

## 2014-07-07 DIAGNOSIS — Z79899 Other long term (current) drug therapy: Secondary | ICD-10-CM | POA: Diagnosis not present

## 2014-07-14 DIAGNOSIS — E785 Hyperlipidemia, unspecified: Secondary | ICD-10-CM | POA: Diagnosis not present

## 2014-07-14 DIAGNOSIS — I10 Essential (primary) hypertension: Secondary | ICD-10-CM | POA: Diagnosis not present

## 2014-07-14 DIAGNOSIS — Z23 Encounter for immunization: Secondary | ICD-10-CM | POA: Diagnosis not present

## 2014-08-07 DIAGNOSIS — H01022 Squamous blepharitis right lower eyelid: Secondary | ICD-10-CM | POA: Diagnosis not present

## 2014-12-01 ENCOUNTER — Other Ambulatory Visit (HOSPITAL_COMMUNITY): Payer: Self-pay | Admitting: Obstetrics and Gynecology

## 2014-12-01 DIAGNOSIS — Z1231 Encounter for screening mammogram for malignant neoplasm of breast: Secondary | ICD-10-CM

## 2014-12-10 ENCOUNTER — Ambulatory Visit (HOSPITAL_COMMUNITY): Payer: Medicare Other

## 2014-12-10 ENCOUNTER — Ambulatory Visit (HOSPITAL_COMMUNITY)
Admission: RE | Admit: 2014-12-10 | Discharge: 2014-12-10 | Disposition: A | Discharge status: Home / Self Care | Payer: Medicare Other | Source: Ambulatory Visit | Attending: Obstetrics and Gynecology | Admitting: Obstetrics and Gynecology

## 2014-12-10 DIAGNOSIS — Z1231 Encounter for screening mammogram for malignant neoplasm of breast: Secondary | ICD-10-CM

## 2015-01-06 DIAGNOSIS — B0052 Herpesviral keratitis: Secondary | ICD-10-CM | POA: Diagnosis not present

## 2015-01-11 DIAGNOSIS — B0052 Herpesviral keratitis: Secondary | ICD-10-CM | POA: Diagnosis not present

## 2015-01-13 DIAGNOSIS — Z01419 Encounter for gynecological examination (general) (routine) without abnormal findings: Secondary | ICD-10-CM | POA: Diagnosis not present

## 2015-02-01 DIAGNOSIS — I10 Essential (primary) hypertension: Secondary | ICD-10-CM | POA: Diagnosis not present

## 2015-02-01 DIAGNOSIS — E785 Hyperlipidemia, unspecified: Secondary | ICD-10-CM | POA: Diagnosis not present

## 2015-02-01 DIAGNOSIS — Z23 Encounter for immunization: Secondary | ICD-10-CM | POA: Diagnosis not present

## 2015-04-08 DIAGNOSIS — H43812 Vitreous degeneration, left eye: Secondary | ICD-10-CM | POA: Diagnosis not present

## 2015-04-08 DIAGNOSIS — H2511 Age-related nuclear cataract, right eye: Secondary | ICD-10-CM | POA: Diagnosis not present

## 2015-04-08 DIAGNOSIS — H35413 Lattice degeneration of retina, bilateral: Secondary | ICD-10-CM | POA: Diagnosis not present

## 2015-04-08 DIAGNOSIS — H25812 Combined forms of age-related cataract, left eye: Secondary | ICD-10-CM | POA: Diagnosis not present

## 2015-04-16 DIAGNOSIS — H43813 Vitreous degeneration, bilateral: Secondary | ICD-10-CM | POA: Diagnosis not present

## 2015-04-27 DIAGNOSIS — L821 Other seborrheic keratosis: Secondary | ICD-10-CM | POA: Diagnosis not present

## 2015-04-27 DIAGNOSIS — L738 Other specified follicular disorders: Secondary | ICD-10-CM | POA: Diagnosis not present

## 2015-04-27 DIAGNOSIS — D1801 Hemangioma of skin and subcutaneous tissue: Secondary | ICD-10-CM | POA: Diagnosis not present

## 2015-04-27 DIAGNOSIS — D225 Melanocytic nevi of trunk: Secondary | ICD-10-CM | POA: Diagnosis not present

## 2015-05-20 DIAGNOSIS — H43813 Vitreous degeneration, bilateral: Secondary | ICD-10-CM | POA: Diagnosis not present

## 2015-06-22 DIAGNOSIS — L720 Epidermal cyst: Secondary | ICD-10-CM | POA: Diagnosis not present

## 2015-07-27 DIAGNOSIS — E785 Hyperlipidemia, unspecified: Secondary | ICD-10-CM | POA: Diagnosis not present

## 2015-07-27 DIAGNOSIS — Z79899 Other long term (current) drug therapy: Secondary | ICD-10-CM | POA: Diagnosis not present

## 2015-07-27 DIAGNOSIS — E039 Hypothyroidism, unspecified: Secondary | ICD-10-CM | POA: Diagnosis not present

## 2015-07-27 DIAGNOSIS — D352 Benign neoplasm of pituitary gland: Secondary | ICD-10-CM | POA: Diagnosis not present

## 2015-07-27 DIAGNOSIS — I10 Essential (primary) hypertension: Secondary | ICD-10-CM | POA: Diagnosis not present

## 2015-08-03 DIAGNOSIS — T783XXA Angioneurotic edema, initial encounter: Secondary | ICD-10-CM | POA: Diagnosis not present

## 2015-08-03 DIAGNOSIS — Z6824 Body mass index (BMI) 24.0-24.9, adult: Secondary | ICD-10-CM | POA: Diagnosis not present

## 2015-08-05 ENCOUNTER — Other Ambulatory Visit (HOSPITAL_COMMUNITY): Payer: Self-pay | Admitting: Internal Medicine

## 2015-08-05 DIAGNOSIS — Z780 Asymptomatic menopausal state: Secondary | ICD-10-CM

## 2015-08-12 ENCOUNTER — Ambulatory Visit (HOSPITAL_COMMUNITY)
Admission: RE | Admit: 2015-08-12 | Discharge: 2015-08-12 | Disposition: A | Discharge status: Home / Self Care | Payer: Medicare Other | Source: Ambulatory Visit | Attending: Internal Medicine | Admitting: Internal Medicine

## 2015-08-12 DIAGNOSIS — Z780 Asymptomatic menopausal state: Secondary | ICD-10-CM | POA: Insufficient documentation

## 2015-08-12 DIAGNOSIS — M85852 Other specified disorders of bone density and structure, left thigh: Secondary | ICD-10-CM | POA: Insufficient documentation

## 2015-08-12 DIAGNOSIS — M8589 Other specified disorders of bone density and structure, multiple sites: Secondary | ICD-10-CM | POA: Diagnosis not present

## 2015-08-14 DIAGNOSIS — R21 Rash and other nonspecific skin eruption: Secondary | ICD-10-CM | POA: Diagnosis not present

## 2015-08-14 DIAGNOSIS — T7840XA Allergy, unspecified, initial encounter: Secondary | ICD-10-CM | POA: Diagnosis not present

## 2015-08-18 DIAGNOSIS — L508 Other urticaria: Secondary | ICD-10-CM | POA: Diagnosis not present

## 2015-08-18 DIAGNOSIS — R5383 Other fatigue: Secondary | ICD-10-CM | POA: Diagnosis not present

## 2015-08-20 DIAGNOSIS — T783XXA Angioneurotic edema, initial encounter: Secondary | ICD-10-CM | POA: Insufficient documentation

## 2015-08-20 DIAGNOSIS — L509 Urticaria, unspecified: Secondary | ICD-10-CM | POA: Insufficient documentation

## 2015-10-20 DIAGNOSIS — T783XXA Angioneurotic edema, initial encounter: Secondary | ICD-10-CM | POA: Diagnosis not present

## 2015-10-20 DIAGNOSIS — L509 Urticaria, unspecified: Secondary | ICD-10-CM | POA: Diagnosis not present

## 2015-10-20 DIAGNOSIS — L508 Other urticaria: Secondary | ICD-10-CM | POA: Diagnosis not present

## 2015-11-01 DIAGNOSIS — L509 Urticaria, unspecified: Secondary | ICD-10-CM | POA: Diagnosis not present

## 2015-11-01 DIAGNOSIS — I10 Essential (primary) hypertension: Secondary | ICD-10-CM | POA: Diagnosis not present

## 2016-02-01 DIAGNOSIS — L508 Other urticaria: Secondary | ICD-10-CM | POA: Diagnosis not present

## 2016-02-03 ENCOUNTER — Other Ambulatory Visit (HOSPITAL_COMMUNITY): Payer: Self-pay | Admitting: Obstetrics and Gynecology

## 2016-02-03 DIAGNOSIS — Z1231 Encounter for screening mammogram for malignant neoplasm of breast: Secondary | ICD-10-CM

## 2016-02-04 DIAGNOSIS — Z23 Encounter for immunization: Secondary | ICD-10-CM | POA: Diagnosis not present

## 2016-02-09 DIAGNOSIS — T7840XA Allergy, unspecified, initial encounter: Secondary | ICD-10-CM | POA: Diagnosis not present

## 2016-02-24 ENCOUNTER — Ambulatory Visit (HOSPITAL_COMMUNITY)
Admission: RE | Admit: 2016-02-24 | Discharge: 2016-02-24 | Disposition: A | Discharge status: Home / Self Care | Payer: Medicare Other | Source: Ambulatory Visit | Attending: Obstetrics and Gynecology | Admitting: Obstetrics and Gynecology

## 2016-02-24 DIAGNOSIS — Z1231 Encounter for screening mammogram for malignant neoplasm of breast: Secondary | ICD-10-CM | POA: Insufficient documentation

## 2016-03-07 DIAGNOSIS — N952 Postmenopausal atrophic vaginitis: Secondary | ICD-10-CM | POA: Diagnosis not present

## 2016-03-07 DIAGNOSIS — Z79890 Hormone replacement therapy (postmenopausal): Secondary | ICD-10-CM | POA: Diagnosis not present

## 2016-03-23 DIAGNOSIS — F3340 Major depressive disorder, recurrent, in remission, unspecified: Secondary | ICD-10-CM | POA: Diagnosis not present

## 2016-03-23 DIAGNOSIS — Z6824 Body mass index (BMI) 24.0-24.9, adult: Secondary | ICD-10-CM | POA: Diagnosis not present

## 2016-03-23 DIAGNOSIS — I10 Essential (primary) hypertension: Secondary | ICD-10-CM | POA: Diagnosis not present

## 2016-04-14 DIAGNOSIS — H018 Other specified inflammations of eyelid: Secondary | ICD-10-CM | POA: Diagnosis not present

## 2016-04-27 DIAGNOSIS — D225 Melanocytic nevi of trunk: Secondary | ICD-10-CM | POA: Diagnosis not present

## 2016-04-27 DIAGNOSIS — D485 Neoplasm of uncertain behavior of skin: Secondary | ICD-10-CM | POA: Diagnosis not present

## 2016-04-27 DIAGNOSIS — L821 Other seborrheic keratosis: Secondary | ICD-10-CM | POA: Diagnosis not present

## 2016-04-28 DIAGNOSIS — I10 Essential (primary) hypertension: Secondary | ICD-10-CM | POA: Diagnosis not present

## 2016-04-28 DIAGNOSIS — J069 Acute upper respiratory infection, unspecified: Secondary | ICD-10-CM | POA: Diagnosis not present

## 2016-04-28 DIAGNOSIS — Z6823 Body mass index (BMI) 23.0-23.9, adult: Secondary | ICD-10-CM | POA: Diagnosis not present

## 2016-04-28 DIAGNOSIS — F339 Major depressive disorder, recurrent, unspecified: Secondary | ICD-10-CM | POA: Diagnosis not present

## 2016-05-26 DIAGNOSIS — D352 Benign neoplasm of pituitary gland: Secondary | ICD-10-CM | POA: Diagnosis not present

## 2016-05-26 DIAGNOSIS — H25812 Combined forms of age-related cataract, left eye: Secondary | ICD-10-CM | POA: Diagnosis not present

## 2016-05-26 DIAGNOSIS — H2511 Age-related nuclear cataract, right eye: Secondary | ICD-10-CM | POA: Diagnosis not present

## 2016-06-13 DIAGNOSIS — T700XXA Otitic barotrauma, initial encounter: Secondary | ICD-10-CM | POA: Diagnosis not present

## 2016-07-29 DIAGNOSIS — E785 Hyperlipidemia, unspecified: Secondary | ICD-10-CM | POA: Diagnosis not present

## 2016-07-29 DIAGNOSIS — D352 Benign neoplasm of pituitary gland: Secondary | ICD-10-CM | POA: Diagnosis not present

## 2016-07-29 DIAGNOSIS — I10 Essential (primary) hypertension: Secondary | ICD-10-CM | POA: Diagnosis not present

## 2016-07-29 DIAGNOSIS — Z79899 Other long term (current) drug therapy: Secondary | ICD-10-CM | POA: Diagnosis not present

## 2016-07-29 DIAGNOSIS — F329 Major depressive disorder, single episode, unspecified: Secondary | ICD-10-CM | POA: Diagnosis not present

## 2016-08-02 DIAGNOSIS — T783XXD Angioneurotic edema, subsequent encounter: Secondary | ICD-10-CM | POA: Diagnosis not present

## 2016-08-02 DIAGNOSIS — L508 Other urticaria: Secondary | ICD-10-CM | POA: Diagnosis not present

## 2016-08-03 DIAGNOSIS — I10 Essential (primary) hypertension: Secondary | ICD-10-CM | POA: Diagnosis not present

## 2016-08-03 DIAGNOSIS — Z23 Encounter for immunization: Secondary | ICD-10-CM | POA: Diagnosis not present

## 2016-08-03 DIAGNOSIS — F3340 Major depressive disorder, recurrent, in remission, unspecified: Secondary | ICD-10-CM | POA: Diagnosis not present

## 2016-08-03 DIAGNOSIS — E785 Hyperlipidemia, unspecified: Secondary | ICD-10-CM | POA: Diagnosis not present

## 2016-08-08 DIAGNOSIS — L501 Idiopathic urticaria: Secondary | ICD-10-CM | POA: Insufficient documentation

## 2016-12-06 DIAGNOSIS — Z23 Encounter for immunization: Secondary | ICD-10-CM | POA: Diagnosis not present

## 2016-12-06 DIAGNOSIS — I10 Essential (primary) hypertension: Secondary | ICD-10-CM | POA: Diagnosis not present

## 2016-12-06 DIAGNOSIS — F3340 Major depressive disorder, recurrent, in remission, unspecified: Secondary | ICD-10-CM | POA: Diagnosis not present

## 2016-12-19 ENCOUNTER — Encounter: Payer: Self-pay | Admitting: Internal Medicine

## 2016-12-19 DIAGNOSIS — Z1211 Encounter for screening for malignant neoplasm of colon: Secondary | ICD-10-CM | POA: Diagnosis not present

## 2016-12-19 DIAGNOSIS — Z1212 Encounter for screening for malignant neoplasm of rectum: Secondary | ICD-10-CM | POA: Diagnosis not present

## 2016-12-19 LAB — COLOGUARD

## 2017-01-02 DIAGNOSIS — L820 Inflamed seborrheic keratosis: Secondary | ICD-10-CM | POA: Diagnosis not present

## 2017-01-02 DIAGNOSIS — D485 Neoplasm of uncertain behavior of skin: Secondary | ICD-10-CM | POA: Diagnosis not present

## 2017-01-10 ENCOUNTER — Telehealth (INDEPENDENT_AMBULATORY_CARE_PROVIDER_SITE_OTHER): Payer: Self-pay | Admitting: *Deleted

## 2017-01-10 NOTE — Telephone Encounter (Signed)
I left message on home phone on10/22/18 & 01/09/17 for Kristen Orr to call me to schedule TCS -- she left message on VM 01/09/17 stating that she was going to [facility] to have her TCS done - her last one 8 years ago was incomplete and she had to do a barium enema which was a bad experience - states she feels it best if she goes to [facility] since she had issue with last one -- She requested I fax her last report to [facility]   I sent Dr Willey Blade a note advising him of this

## 2017-01-11 NOTE — Telephone Encounter (Signed)
Noted  

## 2017-01-26 ENCOUNTER — Other Ambulatory Visit (HOSPITAL_COMMUNITY): Payer: Self-pay | Admitting: Internal Medicine

## 2017-01-26 DIAGNOSIS — Z1231 Encounter for screening mammogram for malignant neoplasm of breast: Secondary | ICD-10-CM

## 2017-02-19 DIAGNOSIS — Z1211 Encounter for screening for malignant neoplasm of colon: Secondary | ICD-10-CM | POA: Diagnosis not present

## 2017-02-20 DIAGNOSIS — H903 Sensorineural hearing loss, bilateral: Secondary | ICD-10-CM | POA: Diagnosis not present

## 2017-03-01 DIAGNOSIS — Z79899 Other long term (current) drug therapy: Secondary | ICD-10-CM | POA: Diagnosis not present

## 2017-03-01 DIAGNOSIS — Z1211 Encounter for screening for malignant neoplasm of colon: Secondary | ICD-10-CM | POA: Diagnosis not present

## 2017-03-01 DIAGNOSIS — K644 Residual hemorrhoidal skin tags: Secondary | ICD-10-CM | POA: Diagnosis not present

## 2017-03-01 DIAGNOSIS — Z87891 Personal history of nicotine dependence: Secondary | ICD-10-CM | POA: Diagnosis not present

## 2017-03-01 DIAGNOSIS — R195 Other fecal abnormalities: Secondary | ICD-10-CM | POA: Diagnosis not present

## 2017-03-01 DIAGNOSIS — K5730 Diverticulosis of large intestine without perforation or abscess without bleeding: Secondary | ICD-10-CM | POA: Diagnosis not present

## 2017-03-01 DIAGNOSIS — K641 Second degree hemorrhoids: Secondary | ICD-10-CM | POA: Diagnosis not present

## 2017-03-02 ENCOUNTER — Encounter (HOSPITAL_COMMUNITY): Payer: Self-pay

## 2017-03-02 ENCOUNTER — Ambulatory Visit (HOSPITAL_COMMUNITY)
Admission: RE | Admit: 2017-03-02 | Discharge: 2017-03-02 | Disposition: A | Discharge status: Home / Self Care | Payer: Medicare Other | Source: Ambulatory Visit | Attending: Internal Medicine | Admitting: Internal Medicine

## 2017-03-02 DIAGNOSIS — Z1231 Encounter for screening mammogram for malignant neoplasm of breast: Secondary | ICD-10-CM | POA: Insufficient documentation

## 2017-03-16 DIAGNOSIS — M85851 Other specified disorders of bone density and structure, right thigh: Secondary | ICD-10-CM | POA: Diagnosis not present

## 2017-03-16 DIAGNOSIS — M85852 Other specified disorders of bone density and structure, left thigh: Secondary | ICD-10-CM | POA: Diagnosis not present

## 2017-03-16 DIAGNOSIS — Z1231 Encounter for screening mammogram for malignant neoplasm of breast: Secondary | ICD-10-CM | POA: Diagnosis not present

## 2017-03-16 DIAGNOSIS — N952 Postmenopausal atrophic vaginitis: Secondary | ICD-10-CM | POA: Diagnosis not present

## 2017-04-03 DIAGNOSIS — E785 Hyperlipidemia, unspecified: Secondary | ICD-10-CM | POA: Diagnosis not present

## 2017-04-03 DIAGNOSIS — Z6822 Body mass index (BMI) 22.0-22.9, adult: Secondary | ICD-10-CM | POA: Diagnosis not present

## 2017-04-03 DIAGNOSIS — I10 Essential (primary) hypertension: Secondary | ICD-10-CM | POA: Diagnosis not present

## 2017-04-11 DIAGNOSIS — L570 Actinic keratosis: Secondary | ICD-10-CM | POA: Diagnosis not present

## 2017-04-11 DIAGNOSIS — L821 Other seborrheic keratosis: Secondary | ICD-10-CM | POA: Diagnosis not present

## 2017-04-11 DIAGNOSIS — D225 Melanocytic nevi of trunk: Secondary | ICD-10-CM | POA: Diagnosis not present

## 2017-04-11 DIAGNOSIS — D2262 Melanocytic nevi of left upper limb, including shoulder: Secondary | ICD-10-CM | POA: Diagnosis not present

## 2017-04-11 DIAGNOSIS — L720 Epidermal cyst: Secondary | ICD-10-CM | POA: Diagnosis not present

## 2017-04-11 DIAGNOSIS — I788 Other diseases of capillaries: Secondary | ICD-10-CM | POA: Diagnosis not present

## 2017-04-11 DIAGNOSIS — D2261 Melanocytic nevi of right upper limb, including shoulder: Secondary | ICD-10-CM | POA: Diagnosis not present

## 2017-04-26 DIAGNOSIS — J209 Acute bronchitis, unspecified: Secondary | ICD-10-CM | POA: Diagnosis not present

## 2017-04-26 DIAGNOSIS — Z6822 Body mass index (BMI) 22.0-22.9, adult: Secondary | ICD-10-CM | POA: Diagnosis not present

## 2017-05-31 DIAGNOSIS — D352 Benign neoplasm of pituitary gland: Secondary | ICD-10-CM | POA: Diagnosis not present

## 2017-05-31 DIAGNOSIS — H04123 Dry eye syndrome of bilateral lacrimal glands: Secondary | ICD-10-CM | POA: Diagnosis not present

## 2017-05-31 DIAGNOSIS — H2513 Age-related nuclear cataract, bilateral: Secondary | ICD-10-CM | POA: Diagnosis not present

## 2017-06-13 DIAGNOSIS — I10 Essential (primary) hypertension: Secondary | ICD-10-CM | POA: Diagnosis not present

## 2017-07-24 DIAGNOSIS — I10 Essential (primary) hypertension: Secondary | ICD-10-CM | POA: Diagnosis not present

## 2017-09-24 DIAGNOSIS — L820 Inflamed seborrheic keratosis: Secondary | ICD-10-CM | POA: Diagnosis not present

## 2017-10-25 DIAGNOSIS — E039 Hypothyroidism, unspecified: Secondary | ICD-10-CM | POA: Diagnosis not present

## 2017-11-27 DIAGNOSIS — I10 Essential (primary) hypertension: Secondary | ICD-10-CM | POA: Diagnosis not present

## 2017-11-27 DIAGNOSIS — F419 Anxiety disorder, unspecified: Secondary | ICD-10-CM | POA: Diagnosis not present

## 2017-11-27 DIAGNOSIS — E039 Hypothyroidism, unspecified: Secondary | ICD-10-CM | POA: Diagnosis not present

## 2017-11-27 DIAGNOSIS — F329 Major depressive disorder, single episode, unspecified: Secondary | ICD-10-CM | POA: Diagnosis not present

## 2017-11-27 DIAGNOSIS — Z79899 Other long term (current) drug therapy: Secondary | ICD-10-CM | POA: Diagnosis not present

## 2017-12-04 DIAGNOSIS — E785 Hyperlipidemia, unspecified: Secondary | ICD-10-CM | POA: Diagnosis not present

## 2017-12-04 DIAGNOSIS — I10 Essential (primary) hypertension: Secondary | ICD-10-CM | POA: Diagnosis not present

## 2017-12-11 DIAGNOSIS — L821 Other seborrheic keratosis: Secondary | ICD-10-CM | POA: Diagnosis not present

## 2017-12-11 DIAGNOSIS — L820 Inflamed seborrheic keratosis: Secondary | ICD-10-CM | POA: Diagnosis not present

## 2017-12-11 DIAGNOSIS — D225 Melanocytic nevi of trunk: Secondary | ICD-10-CM | POA: Diagnosis not present

## 2017-12-11 DIAGNOSIS — L298 Other pruritus: Secondary | ICD-10-CM | POA: Diagnosis not present

## 2017-12-11 DIAGNOSIS — L538 Other specified erythematous conditions: Secondary | ICD-10-CM | POA: Diagnosis not present

## 2018-01-15 DIAGNOSIS — Z23 Encounter for immunization: Secondary | ICD-10-CM | POA: Diagnosis not present

## 2018-02-07 ENCOUNTER — Other Ambulatory Visit (HOSPITAL_COMMUNITY): Payer: Self-pay | Admitting: Obstetrics and Gynecology

## 2018-02-07 DIAGNOSIS — Z1231 Encounter for screening mammogram for malignant neoplasm of breast: Secondary | ICD-10-CM

## 2018-03-04 ENCOUNTER — Ambulatory Visit (HOSPITAL_COMMUNITY)
Admission: RE | Admit: 2018-03-04 | Discharge: 2018-03-04 | Disposition: A | Discharge status: Home / Self Care | Payer: Medicare Other | Source: Ambulatory Visit | Attending: Obstetrics and Gynecology | Admitting: Obstetrics and Gynecology

## 2018-03-04 DIAGNOSIS — Z1231 Encounter for screening mammogram for malignant neoplasm of breast: Secondary | ICD-10-CM | POA: Diagnosis not present

## 2018-03-26 DIAGNOSIS — I10 Essential (primary) hypertension: Secondary | ICD-10-CM | POA: Diagnosis not present

## 2018-04-09 DIAGNOSIS — L720 Epidermal cyst: Secondary | ICD-10-CM | POA: Diagnosis not present

## 2018-04-09 DIAGNOSIS — D225 Melanocytic nevi of trunk: Secondary | ICD-10-CM | POA: Diagnosis not present

## 2018-04-09 DIAGNOSIS — L821 Other seborrheic keratosis: Secondary | ICD-10-CM | POA: Diagnosis not present

## 2018-06-26 ENCOUNTER — Encounter (HOSPITAL_COMMUNITY): Payer: Medicare Other

## 2018-06-26 ENCOUNTER — Encounter: Payer: Medicare Other | Admitting: Vascular Surgery

## 2018-07-26 ENCOUNTER — Encounter: Payer: Self-pay | Admitting: Vascular Surgery

## 2018-07-26 ENCOUNTER — Other Ambulatory Visit: Payer: Self-pay

## 2018-07-26 ENCOUNTER — Telehealth: Payer: Self-pay | Admitting: Vascular Surgery

## 2018-07-26 ENCOUNTER — Ambulatory Visit (INDEPENDENT_AMBULATORY_CARE_PROVIDER_SITE_OTHER): Payer: Medicare Other | Admitting: Vascular Surgery

## 2018-07-26 VITALS — Ht 66.0 in | Wt 140.0 lb

## 2018-07-26 DIAGNOSIS — I83893 Varicose veins of bilateral lower extremities with other complications: Secondary | ICD-10-CM | POA: Diagnosis not present

## 2018-07-26 NOTE — Progress Notes (Signed)
Virtual Visit via Video Note   I connected with Kristen Orr on 07/26/2018 using the [employer] virtual platform and verified that I was speaking with the correct person using two identifiers. Patient was located at home was alone and I was located at [facility] office   The limitations of evaluation and management by telemedicine and the availability of in person appointments have been previously discussed with the patient and are documented in the patients chart. The patient expressed understanding and consented to proceed.   PCP: Asencion Noble, MD   Chief Complaint: Bilateral lower extremity varicose veins and telangiectasias  History of Present Illness: Kristen Orr is a 73 y.o. female with history of varicose veins.  She has never had intervention.  Has never had DVT.  Has not had any bleeding or complications.  States she does have discomfort in her legs.  She has swelling after she is on her feet for a long time during the day.  She does not wear compression stockings.  No tissue loss or ulceration.  Past Medical History:  Diagnosis Date  . Allergy   . Hyperlipidemia   . Hypertension   . Varicose veins     Past Surgical History:  Procedure Laterality Date  . PITUITARY SURGERY    . TONSILLECTOMY    . VARICOSE VEIN SURGERY      Current Meds  Medication Sig  . Ascorbic Acid (VITAMIN C PO) Take 1 tablet by mouth 2 (two) times daily.  . B Complex Vitamins (VITAMIN B COMPLEX PO) Take 1 tablet by mouth daily.  Marland Kitchen buPROPion (WELLBUTRIN XL) 150 MG 24 hr tablet   . chlorthalidone (HYGROTON) 25 MG tablet   . citalopram (CELEXA) 20 MG tablet Take 20 mg by mouth daily.  Marland Kitchen ezetimibe (ZETIA) 10 MG tablet   . fexofenadine (ALLEGRA) 180 MG tablet Take 180 mg by mouth daily as needed for allergies or rhinitis.  Marland Kitchen ibuprofen (ADVIL,MOTRIN) 200 MG tablet Take 200 mg by mouth every 6 (six) hours as needed for moderate pain.  Marland Kitchen irbesartan (AVAPRO) 150 MG  tablet   . VAGIFEM 10 MCG TABS vaginal tablet Take 1 tablet by mouth 2 (two) times a week.     12 system ROS was negative unless otherwise noted in HPI  Observations/Objective: There were no vitals filed for this visit. Patient could not check blood pressure at home She is awake alert and oriented Nonlabored respirations.  She is speaking fluently without any apparent neurologic deficits.  She appears to have minimal swelling bilateral lower extremities by video evaluation and has what appear to be telangiectasias bilateral lower extremities  Assessment and Plan: 73 year old female with possible C2 venous disease.  I prescribed compression stockings we will send her information on elastic therapy and I have given her the number to call so she can be measured in order.  She will follow-up in 3 months with lower extremity reflux studies.  Follow Up Instructions: 3 months with reflux study  I discussed the assessment and treatment plan with the patient. The patient was provided an opportunity to ask questions and all were answered. The patient agreed with the plan and demonstrated an understanding of the instructions.   The patient was advised to call back or seek an in-person evaluation if the symptoms worsen or if the condition fails to improve as anticipated.  I spent 18 minutes with the patient via video and telephone encounter. Video function failed and was converted to  telephone visit.   Signed, Servando Snare [facility] [place] Office: [phone]  07/26/2018, 3:23 PM

## 2018-07-26 NOTE — Telephone Encounter (Signed)
Virtual Visit Pre-Appointment Phone Call  Today, I spoke with Kristen Orr and performed the following actions:  1. I explained that we are currently trying to limit exposure to the COVID-19 virus by seeing patients at home rather than in the office.  I explained that the visits are best done by video, but can be done by telephone.  I asked the patient if a virtual visit that the patient would like to try instead of coming into the office. Kristen Orr agreed to proceed with the virtual visit scheduled with Dr. Donzetta Matters on May 8 at 12:30.    2. I confirmed the BEST phone number to call the day of the visit and- I included this in appointment notes.  3. I asked if the patient had access to (through a family member/friend) a smartphone with video capability to be used for her visit?"  The patient said YES.  4. I confirmed consent by verbally as listed below (a-f). a. This visit is being performed in the setting of COVID-19 with the patients provider on May 8 at 12:30pm. b. All virtual visits are billed to your insurance company just like a normal visit would be.   c. We'd like you to understand that the technology does not allow for your provider to perform an examination, and thus may limit your provider's ability to fully assess your condition.  d. If your provider identifies any concerns that need to be evaluated in person, we will make arrangements to do so.   e. Finally, though the technology is pretty good, we cannot assure that it will always work on either your or our end, and in the setting of a video visit, we may have to convert it to a phone-only visit.  In either situation, we cannot ensure that we have a secure connection.   [name] you willing to proceed?" STAFF: Did the patient verbally acknowledge consent to telehealth visit? Document YES/NO here: YES  2. I advised the patient to be prepared - I asked that the patient, on the day of her visit, record any information  possible with the equipment at her home, such as blood pressure, pulse, oxygen saturation, and your weight and write them all down. I asked the patient to have a pen and paper handy nearby the day of the visit as well.  3. I informed the patient that the visit with the doctor would start with a text to the smartphone number given to [place] by the patient.   4. I Informed patient they will receive a phone call 15 minutes prior to their appointment time from a Mariano Orr or nurse to review medications, allergies, etc. to prepare for the visit.    TELEPHONE CALL NOTE  [name] has been deemed a candidate for a follow-up tele-health visit to limit community exposure during the Covid-19 pandemic. I spoke with the patient via phone to ensure availability of phone/video source, confirm preferred email & phone number, and discuss instructions and expectations.  I reminded Kristen Orr to be prepared with any vital sign and/or heart rhythm information that could potentially be obtained via home monitoring, at the time of her visit. I reminded Kristen Orr to expect a phone call prior to her visit.  Kristen Orr 07/26/2018 10:34 AM     FULL LENGTH CONSENT FOR TELE-HEALTH VISIT   I hereby voluntarily request, consent and authorize [facility] and its employed or contracted physicians, Engineer, materials, nurse practitioners or other licensed health  care professionals (the Practitioner), to provide me with telemedicine health care services (the Services") as deemed necessary by the treating Practitioner. I acknowledge and consent to receive the Services by the Practitioner via telemedicine. I understand that the telemedicine visit will involve communicating with the Practitioner through live audiovisual communication technology and the disclosure of certain medical information by electronic transmission. I acknowledge that I have been given the opportunity to request an in-person assessment  or other available alternative prior to the telemedicine visit and am voluntarily participating in the telemedicine visit.  I understand that I have the right to withhold or withdraw my consent to the use of telemedicine in the course of my care at any time, without affecting my right to future care or treatment, and that the Practitioner or I may terminate the telemedicine visit at any time. I understand that I have the right to inspect all information obtained and/or recorded in the course of the telemedicine visit and may receive copies of available information for a reasonable fee.  I understand that some of the potential risks of receiving the Services via telemedicine include:   Delay or interruption in medical evaluation due to technological equipment failure or disruption;  Information transmitted may not be sufficient (e.g. poor resolution of images) to allow for appropriate medical decision making by the Practitioner; and/or   In rare instances, security protocols could fail, causing a breach of personal health information.  Furthermore, I acknowledge that it is my responsibility to provide information about my medical history, conditions and care that is complete and accurate to the best of my ability. I acknowledge that Practitioner's advice, recommendations, and/or decision may be based on factors not within their control, such as incomplete or inaccurate data provided by me or distortions of diagnostic images or specimens that may result from electronic transmissions. I understand that the practice of medicine is not an exact science and that Practitioner makes no warranties or guarantees regarding treatment outcomes. I acknowledge that I will receive a copy of this consent concurrently upon execution via email to the email address I last provided but may also request a printed copy by calling the office of [place].    I understand that my insurance will be billed for this visit.    I have read or had this consent read to me.  I understand the contents of this consent, which adequately explains the benefits and risks of the Services being provided via telemedicine.   I have been provided ample opportunity to ask questions regarding this consent and the Services and have had my questions answered to my satisfaction.  I give my informed consent for the services to be provided through the use of telemedicine in my medical care  By participating in this telemedicine visit I agree to the above.

## 2018-07-30 DIAGNOSIS — M79641 Pain in right hand: Secondary | ICD-10-CM | POA: Diagnosis not present

## 2018-07-30 DIAGNOSIS — I10 Essential (primary) hypertension: Secondary | ICD-10-CM | POA: Diagnosis not present

## 2018-09-25 DIAGNOSIS — H2513 Age-related nuclear cataract, bilateral: Secondary | ICD-10-CM | POA: Diagnosis not present

## 2018-09-25 DIAGNOSIS — H5213 Myopia, bilateral: Secondary | ICD-10-CM | POA: Diagnosis not present

## 2018-09-25 DIAGNOSIS — H35412 Lattice degeneration of retina, left eye: Secondary | ICD-10-CM | POA: Diagnosis not present

## 2018-10-29 ENCOUNTER — Other Ambulatory Visit: Payer: Self-pay

## 2018-10-29 DIAGNOSIS — I83893 Varicose veins of bilateral lower extremities with other complications: Secondary | ICD-10-CM

## 2018-10-30 ENCOUNTER — Ambulatory Visit: Payer: Medicare Other | Admitting: Vascular Surgery

## 2018-10-30 ENCOUNTER — Encounter (HOSPITAL_COMMUNITY): Payer: Medicare Other

## 2018-11-06 ENCOUNTER — Telehealth (HOSPITAL_COMMUNITY): Payer: Self-pay | Admitting: *Deleted

## 2018-11-06 NOTE — Telephone Encounter (Signed)
The above patient or their representative was contacted and gave the following answers to these questions:         Do you have any of the following symptoms?n  Fever                    Cough                   Shortness of breath  Do  you have any of the following other symptoms? n   muscle pain         vomiting,        diarrhea        rash         weakness        red eye        abdominal pain         bruising          bruising or bleeding              joint pain           severe headache    Have you been in contact with someone who was or has been sick in the past 2 weeks?n  Yes                 Unsure                         Unable to assess   Does the person that you were in contact with have any of the following symptoms?   Cough         shortness of breath           muscle pain         vomiting,            diarrhea            rash            weakness           fever            red eye           abdominal pain           bruising  or  bleeding                joint pain                severe headache               Have you  or someone you have been in contact with traveled internationally in th last month?         If yes, which countries?   Have you  or someone you have been in contact with traveled outside [place] in th last month?         If yes, which state and city?   COMMENTS OR ACTION PLAN FOR THIS PATIENT:         

## 2018-11-07 ENCOUNTER — Ambulatory Visit (INDEPENDENT_AMBULATORY_CARE_PROVIDER_SITE_OTHER): Payer: Medicare Other | Admitting: Vascular Surgery

## 2018-11-07 ENCOUNTER — Other Ambulatory Visit: Payer: Self-pay

## 2018-11-07 ENCOUNTER — Encounter: Payer: Self-pay | Admitting: Vascular Surgery

## 2018-11-07 ENCOUNTER — Ambulatory Visit (HOSPITAL_COMMUNITY)
Admission: RE | Admit: 2018-11-07 | Discharge: 2018-11-07 | Disposition: A | Discharge status: Home / Self Care | Payer: Medicare Other | Source: Ambulatory Visit | Attending: Vascular Surgery | Admitting: Vascular Surgery

## 2018-11-07 VITALS — BP 135/77 | HR 69 | Temp 97.1°F | Resp 14 | Ht 66.0 in | Wt 140.0 lb

## 2018-11-07 DIAGNOSIS — I83893 Varicose veins of bilateral lower extremities with other complications: Secondary | ICD-10-CM | POA: Diagnosis not present

## 2018-11-07 NOTE — Progress Notes (Signed)
Patient name: Kristen Orr MRN: [id number] DOB: 04/18/1945 Sex: female  REASON FOR VISIT:   Follow-up of painful varicose veins.  HPI:   Kristen Orr is a pleasant 73 y.o. female who had a virtual visit with Dr. Servando Snare on 07/26/2018.  She has a history of varicose veins.  She has not had any previous interventions.  She has no history of previous DVT.  She had significant discomfort in her legs which was aggravated by standing and sitting and relieved somewhat with elevation.  On her video visit she was noted to have a some mild bilateral lower extremity swelling and telangiectasias bilaterally.  She was felt to have CEAP C2 venous disease.  She was prescribed compression stockings and instructed to take ibuprofen as needed for pain and elevate her legs.  She comes in for a three-month reflux study.  On my history, the patient does describe some aching pain in her legs which is aggravated by standing and relieved with elevation.  She has tried the compression stockings but feels that they are very tight and difficult to get on and get uncomfortable with time.  She is unaware of any previous history of DVT or phlebitis.  She does complain of some right hip pain which occurs with sitting and sometimes when she sleeping.  She also occasionally has some paresthesias in her toes in the left foot.  Of note I do have records that show she underwent laser ablation of both great saphenous veins in 2012.  Past Medical History:  Diagnosis Date  . Allergy   . Hyperlipidemia   . Hypertension   . Varicose veins     Family History  Problem Relation Age of Onset  . Cancer Mother   . Hyperlipidemia Mother   . Hypertension Mother   . Cancer Father   . Deep vein thrombosis Sister     SOCIAL HISTORY: Social History   Tobacco Use  . Smoking status: Former Smoker    Quit date: 04/29/1976    Years since quitting: 42.5  . Smokeless tobacco: Never Used  Substance Use Topics  . Alcohol  use: Yes    Allergies  Allergen Reactions  . Tape Hives  . Eggs Or Egg-Derived Products   . Penicillins Rash, Hives and Other (See Comments)    Current Outpatient Medications  Medication Sig Dispense Refill  . Ascorbic Acid (VITAMIN C PO) Take 1 tablet by mouth 2 (two) times daily.    . B Complex Vitamins (VITAMIN B COMPLEX PO) Take 1 tablet by mouth daily.    Marland Kitchen buPROPion (WELLBUTRIN XL) 150 MG 24 hr tablet     . chlorthalidone (HYGROTON) 25 MG tablet     . citalopram (CELEXA) 20 MG tablet Take 20 mg by mouth daily.    Marland Kitchen ezetimibe (ZETIA) 10 MG tablet     . fexofenadine (ALLEGRA) 180 MG tablet Take 180 mg by mouth daily as needed for allergies or rhinitis.    Marland Kitchen ibuprofen (ADVIL,MOTRIN) 200 MG tablet Take 200 mg by mouth every 6 (six) hours as needed for moderate pain.    Marland Kitchen irbesartan (AVAPRO) 150 MG tablet     . silver sulfADIAZINE (SILVADENE) 1 % cream Apply 1 application topically daily. 50 g 0  . VAGIFEM 10 MCG TABS vaginal tablet Take 1 tablet by mouth 2 (two) times a week.      No current facility-administered medications for this visit.     REVIEW OF SYSTEMS:  [X]  denotes positive finding, [ ]   denotes negative finding Cardiac  Comments:  Chest pain or chest pressure:    Shortness of breath upon exertion:    Short of breath when lying flat:    Irregular heart rhythm:        Vascular    Pain in calf, thigh, or hip brought on by ambulation:    Pain in feet at night that wakes you up from your sleep:     Blood clot in your veins:    Leg swelling:         Pulmonary    Oxygen at home:    Productive cough:     Wheezing:         Neurologic    Sudden weakness in arms or legs:     Sudden numbness in arms or legs:     Sudden onset of difficulty speaking or slurred speech:    Temporary loss of vision in one eye:     Problems with dizziness:         Gastrointestinal    Blood in stool:     Vomited blood:         Genitourinary    Burning when urinating:     Blood in  urine:        Psychiatric    Major depression:         Hematologic    Bleeding problems:    Problems with blood clotting too easily:        Skin    Rashes or ulcers:        Constitutional    Fever or chills:     PHYSICAL EXAM:   Vitals:   11/07/18 1314  BP: 135/77  Pulse: 69  Resp: 14  Temp: (!) 97.1 F (36.2 C)  TempSrc: Temporal  SpO2: 98%  Weight: 140 lb (63.5 kg)  Height: 5\' 6"  (1.676 m)    GENERAL: The patient is a well-nourished female, in no acute distress. The vital signs are documented above. CARDIAC: There is a regular rate and rhythm.  VASCULAR: I do not detect carotid bruits. She has palpable pedal pulses. She has some telangiectasias on her right medial thigh and anterior thigh.  She has no large truncal varicosities.  She has no significant hyperpigmentation.  She has minimal leg swelling. PULMONARY: There is good air exchange bilaterally without wheezing or rales. ABDOMEN: Soft and non-tender with normal pitched bowel sounds.  MUSCULOSKELETAL: There are no major deformities or cyanosis. NEUROLOGIC: No focal weakness or paresthesias are detected. SKIN: There are no ulcers or rashes noted. PSYCHIATRIC: The patient has a normal affect.  DATA:    VENOUS DUPLEX: I have independently interpreted her venous duplex scan today.    On the left side there is no evidence of DVT or superficial venous thrombosis.  There is deep venous reflux involving the common femoral vein and femoral vein.  There is no reflux in the short saphenous vein.  Her great saphenous vein has been ablated.  On the right side, there is no evidence of DVT or superficial venous thrombosis.  There is deep venous reflux involving the common femoral vein.  The right great saphenous vein has been ablated.  There is no reflux in the small saphenous vein.  MEDICAL ISSUES:   CHRONIC VENOUS INSUFFICIENCY: Based on her venous duplex scan she does have some deep venous reflux bilaterally.  She has  had successful ablation of both great saphenous veins.  We have discussed the importance of intermittent leg elevation  and the proper positioning for this.  In addition I have written her a prescription for knee-high compression stockings with a gradient of 15 to 20 mmHg.  I encouraged her to avoid prolonged sitting and standing.  We have discussed the importance of exercise specifically walking and water aerobics.  I will be happy to see her back at any time if her symptoms progress.  If she wants to consider sclerotherapy I have given her our number for Penni Homans, RN who does our sclerotherapy.  Deitra Mayo [facility] [phone]

## 2018-12-02 DIAGNOSIS — B0089 Other herpesviral infection: Secondary | ICD-10-CM | POA: Diagnosis not present

## 2018-12-02 DIAGNOSIS — L905 Scar conditions and fibrosis of skin: Secondary | ICD-10-CM | POA: Diagnosis not present

## 2018-12-02 DIAGNOSIS — L814 Other melanin hyperpigmentation: Secondary | ICD-10-CM | POA: Diagnosis not present

## 2018-12-02 DIAGNOSIS — L821 Other seborrheic keratosis: Secondary | ICD-10-CM | POA: Diagnosis not present

## 2018-12-03 DIAGNOSIS — E785 Hyperlipidemia, unspecified: Secondary | ICD-10-CM | POA: Diagnosis not present

## 2018-12-03 DIAGNOSIS — Z79899 Other long term (current) drug therapy: Secondary | ICD-10-CM | POA: Diagnosis not present

## 2018-12-03 DIAGNOSIS — I10 Essential (primary) hypertension: Secondary | ICD-10-CM | POA: Diagnosis not present

## 2018-12-03 DIAGNOSIS — F329 Major depressive disorder, single episode, unspecified: Secondary | ICD-10-CM | POA: Diagnosis not present

## 2018-12-03 DIAGNOSIS — Z86011 Personal history of benign neoplasm of the brain: Secondary | ICD-10-CM | POA: Diagnosis not present

## 2018-12-10 DIAGNOSIS — E785 Hyperlipidemia, unspecified: Secondary | ICD-10-CM | POA: Diagnosis not present

## 2018-12-10 DIAGNOSIS — F329 Major depressive disorder, single episode, unspecified: Secondary | ICD-10-CM | POA: Diagnosis not present

## 2018-12-10 DIAGNOSIS — I10 Essential (primary) hypertension: Secondary | ICD-10-CM | POA: Diagnosis not present

## 2018-12-26 DIAGNOSIS — Z23 Encounter for immunization: Secondary | ICD-10-CM | POA: Diagnosis not present

## 2019-01-21 DIAGNOSIS — Z01419 Encounter for gynecological examination (general) (routine) without abnormal findings: Secondary | ICD-10-CM | POA: Diagnosis not present

## 2019-02-21 ENCOUNTER — Other Ambulatory Visit (HOSPITAL_COMMUNITY): Payer: Self-pay | Admitting: Obstetrics and Gynecology

## 2019-02-21 DIAGNOSIS — M85859 Other specified disorders of bone density and structure, unspecified thigh: Secondary | ICD-10-CM

## 2019-02-21 DIAGNOSIS — Z780 Asymptomatic menopausal state: Secondary | ICD-10-CM

## 2019-02-21 DIAGNOSIS — Z1231 Encounter for screening mammogram for malignant neoplasm of breast: Secondary | ICD-10-CM

## 2019-03-07 ENCOUNTER — Other Ambulatory Visit: Payer: Self-pay

## 2019-03-07 ENCOUNTER — Ambulatory Visit (HOSPITAL_COMMUNITY)
Admission: RE | Admit: 2019-03-07 | Discharge: 2019-03-07 | Disposition: A | Discharge status: Home / Self Care | Payer: Medicare Other | Source: Ambulatory Visit | Attending: Obstetrics and Gynecology | Admitting: Obstetrics and Gynecology

## 2019-03-07 DIAGNOSIS — M85859 Other specified disorders of bone density and structure, unspecified thigh: Secondary | ICD-10-CM

## 2019-03-07 DIAGNOSIS — Z1231 Encounter for screening mammogram for malignant neoplasm of breast: Secondary | ICD-10-CM | POA: Insufficient documentation

## 2019-03-25 DIAGNOSIS — L820 Inflamed seborrheic keratosis: Secondary | ICD-10-CM | POA: Diagnosis not present

## 2019-06-12 DIAGNOSIS — H25813 Combined forms of age-related cataract, bilateral: Secondary | ICD-10-CM | POA: Diagnosis not present

## 2019-06-12 DIAGNOSIS — H04121 Dry eye syndrome of right lacrimal gland: Secondary | ICD-10-CM | POA: Diagnosis not present

## 2019-07-14 DIAGNOSIS — L821 Other seborrheic keratosis: Secondary | ICD-10-CM | POA: Diagnosis not present

## 2019-07-14 DIAGNOSIS — L820 Inflamed seborrheic keratosis: Secondary | ICD-10-CM | POA: Diagnosis not present

## 2019-07-14 DIAGNOSIS — D2239 Melanocytic nevi of other parts of face: Secondary | ICD-10-CM | POA: Diagnosis not present

## 2019-07-14 DIAGNOSIS — L309 Dermatitis, unspecified: Secondary | ICD-10-CM | POA: Diagnosis not present

## 2019-07-21 ENCOUNTER — Other Ambulatory Visit (HOSPITAL_COMMUNITY): Payer: Self-pay | Admitting: Emergency Medicine

## 2019-07-21 ENCOUNTER — Other Ambulatory Visit (HOSPITAL_COMMUNITY): Payer: Self-pay | Admitting: Obstetrics and Gynecology

## 2019-10-23 ENCOUNTER — Other Ambulatory Visit: Payer: Self-pay

## 2019-10-23 ENCOUNTER — Other Ambulatory Visit (HOSPITAL_COMMUNITY): Payer: Self-pay | Admitting: Internal Medicine

## 2019-10-23 ENCOUNTER — Ambulatory Visit (HOSPITAL_COMMUNITY)
Admission: RE | Admit: 2019-10-23 | Discharge: 2019-10-23 | Disposition: A | Discharge status: Home / Self Care | Payer: Medicare HMO | Source: Ambulatory Visit | Attending: Internal Medicine | Admitting: Internal Medicine

## 2019-10-23 DIAGNOSIS — R059 Cough, unspecified: Secondary | ICD-10-CM

## 2019-10-23 DIAGNOSIS — M1991 Primary osteoarthritis, unspecified site: Secondary | ICD-10-CM | POA: Diagnosis not present

## 2019-10-23 DIAGNOSIS — R05 Cough: Secondary | ICD-10-CM | POA: Diagnosis not present

## 2019-10-23 DIAGNOSIS — I10 Essential (primary) hypertension: Secondary | ICD-10-CM | POA: Diagnosis not present

## 2019-10-23 DIAGNOSIS — S60931A Unspecified superficial injury of right thumb, initial encounter: Secondary | ICD-10-CM | POA: Diagnosis not present

## 2019-12-22 DIAGNOSIS — M13841 Other specified arthritis, right hand: Secondary | ICD-10-CM | POA: Diagnosis not present

## 2019-12-22 DIAGNOSIS — M13849 Other specified arthritis, unspecified hand: Secondary | ICD-10-CM | POA: Diagnosis not present

## 2019-12-22 DIAGNOSIS — M79644 Pain in right finger(s): Secondary | ICD-10-CM | POA: Diagnosis not present

## 2020-01-19 DIAGNOSIS — H40013 Open angle with borderline findings, low risk, bilateral: Secondary | ICD-10-CM | POA: Diagnosis not present

## 2020-02-02 ENCOUNTER — Other Ambulatory Visit (HOSPITAL_COMMUNITY): Payer: Self-pay | Admitting: Internal Medicine

## 2020-02-02 DIAGNOSIS — Z1231 Encounter for screening mammogram for malignant neoplasm of breast: Secondary | ICD-10-CM

## 2020-03-08 ENCOUNTER — Ambulatory Visit (HOSPITAL_COMMUNITY)
Admission: RE | Admit: 2020-03-08 | Discharge: 2020-03-08 | Disposition: A | Discharge status: Home / Self Care | Payer: Medicare HMO | Source: Ambulatory Visit | Attending: Internal Medicine | Admitting: Internal Medicine

## 2020-03-08 ENCOUNTER — Other Ambulatory Visit: Payer: Self-pay

## 2020-03-08 DIAGNOSIS — Z1231 Encounter for screening mammogram for malignant neoplasm of breast: Secondary | ICD-10-CM | POA: Insufficient documentation

## 2020-05-07 DIAGNOSIS — Z79899 Other long term (current) drug therapy: Secondary | ICD-10-CM | POA: Diagnosis not present

## 2020-05-07 DIAGNOSIS — F329 Major depressive disorder, single episode, unspecified: Secondary | ICD-10-CM | POA: Diagnosis not present

## 2020-05-07 DIAGNOSIS — E785 Hyperlipidemia, unspecified: Secondary | ICD-10-CM | POA: Diagnosis not present

## 2020-05-07 DIAGNOSIS — I10 Essential (primary) hypertension: Secondary | ICD-10-CM | POA: Diagnosis not present

## 2020-05-07 DIAGNOSIS — M1990 Unspecified osteoarthritis, unspecified site: Secondary | ICD-10-CM | POA: Diagnosis not present

## 2020-05-27 DIAGNOSIS — E785 Hyperlipidemia, unspecified: Secondary | ICD-10-CM | POA: Diagnosis not present

## 2020-05-27 DIAGNOSIS — I10 Essential (primary) hypertension: Secondary | ICD-10-CM | POA: Diagnosis not present

## 2020-05-27 DIAGNOSIS — F325 Major depressive disorder, single episode, in full remission: Secondary | ICD-10-CM | POA: Diagnosis not present

## 2020-06-03 DIAGNOSIS — L820 Inflamed seborrheic keratosis: Secondary | ICD-10-CM | POA: Diagnosis not present

## 2020-06-03 DIAGNOSIS — D225 Melanocytic nevi of trunk: Secondary | ICD-10-CM | POA: Diagnosis not present

## 2020-06-03 DIAGNOSIS — L821 Other seborrheic keratosis: Secondary | ICD-10-CM | POA: Diagnosis not present

## 2020-06-03 DIAGNOSIS — L438 Other lichen planus: Secondary | ICD-10-CM | POA: Diagnosis not present

## 2020-06-03 DIAGNOSIS — D2261 Melanocytic nevi of right upper limb, including shoulder: Secondary | ICD-10-CM | POA: Diagnosis not present

## 2020-06-03 DIAGNOSIS — D2262 Melanocytic nevi of left upper limb, including shoulder: Secondary | ICD-10-CM | POA: Diagnosis not present

## 2020-11-29 DIAGNOSIS — F3340 Major depressive disorder, recurrent, in remission, unspecified: Secondary | ICD-10-CM | POA: Diagnosis not present

## 2020-11-29 DIAGNOSIS — I10 Essential (primary) hypertension: Secondary | ICD-10-CM | POA: Diagnosis not present

## 2020-12-21 DIAGNOSIS — M25561 Pain in right knee: Secondary | ICD-10-CM | POA: Diagnosis not present

## 2020-12-21 DIAGNOSIS — N390 Urinary tract infection, site not specified: Secondary | ICD-10-CM | POA: Diagnosis not present

## 2021-01-10 DIAGNOSIS — I10 Essential (primary) hypertension: Secondary | ICD-10-CM | POA: Diagnosis not present

## 2021-01-10 DIAGNOSIS — S60011A Contusion of right thumb without damage to nail, initial encounter: Secondary | ICD-10-CM | POA: Diagnosis not present

## 2021-01-10 DIAGNOSIS — M19041 Primary osteoarthritis, right hand: Secondary | ICD-10-CM | POA: Diagnosis not present

## 2021-01-10 DIAGNOSIS — Z880 Allergy status to penicillin: Secondary | ICD-10-CM | POA: Diagnosis not present

## 2021-01-10 DIAGNOSIS — G8911 Acute pain due to trauma: Secondary | ICD-10-CM | POA: Diagnosis not present

## 2021-01-10 DIAGNOSIS — M7989 Other specified soft tissue disorders: Secondary | ICD-10-CM | POA: Diagnosis not present

## 2021-01-24 DIAGNOSIS — D2272 Melanocytic nevi of left lower limb, including hip: Secondary | ICD-10-CM | POA: Diagnosis not present

## 2021-01-24 DIAGNOSIS — L821 Other seborrheic keratosis: Secondary | ICD-10-CM | POA: Diagnosis not present

## 2021-01-24 DIAGNOSIS — L570 Actinic keratosis: Secondary | ICD-10-CM | POA: Diagnosis not present

## 2021-01-24 DIAGNOSIS — D2271 Melanocytic nevi of right lower limb, including hip: Secondary | ICD-10-CM | POA: Diagnosis not present

## 2021-01-28 DIAGNOSIS — M25561 Pain in right knee: Secondary | ICD-10-CM | POA: Diagnosis not present

## 2021-02-16 DIAGNOSIS — H25813 Combined forms of age-related cataract, bilateral: Secondary | ICD-10-CM | POA: Diagnosis not present

## 2021-02-25 DIAGNOSIS — M25561 Pain in right knee: Secondary | ICD-10-CM | POA: Diagnosis not present

## 2021-03-07 DIAGNOSIS — M85852 Other specified disorders of bone density and structure, left thigh: Secondary | ICD-10-CM | POA: Diagnosis not present

## 2021-03-07 DIAGNOSIS — N952 Postmenopausal atrophic vaginitis: Secondary | ICD-10-CM | POA: Diagnosis not present

## 2021-03-07 DIAGNOSIS — Z1231 Encounter for screening mammogram for malignant neoplasm of breast: Secondary | ICD-10-CM | POA: Diagnosis not present

## 2021-03-07 DIAGNOSIS — M85851 Other specified disorders of bone density and structure, right thigh: Secondary | ICD-10-CM | POA: Diagnosis not present

## 2021-03-10 DIAGNOSIS — M25561 Pain in right knee: Secondary | ICD-10-CM | POA: Diagnosis not present

## 2021-03-15 ENCOUNTER — Other Ambulatory Visit (HOSPITAL_COMMUNITY): Payer: Self-pay | Admitting: Internal Medicine

## 2021-03-15 DIAGNOSIS — Z1231 Encounter for screening mammogram for malignant neoplasm of breast: Secondary | ICD-10-CM

## 2021-03-18 DIAGNOSIS — I10 Essential (primary) hypertension: Secondary | ICD-10-CM | POA: Diagnosis not present

## 2021-03-18 DIAGNOSIS — E785 Hyperlipidemia, unspecified: Secondary | ICD-10-CM | POA: Diagnosis not present

## 2021-03-23 DIAGNOSIS — M25561 Pain in right knee: Secondary | ICD-10-CM | POA: Diagnosis not present

## 2021-03-25 ENCOUNTER — Other Ambulatory Visit: Payer: Self-pay

## 2021-03-25 ENCOUNTER — Ambulatory Visit (HOSPITAL_COMMUNITY)
Admission: RE | Admit: 2021-03-25 | Discharge: 2021-03-25 | Disposition: A | Discharge status: Home / Self Care | Payer: Medicare HMO | Source: Ambulatory Visit | Attending: Internal Medicine | Admitting: Internal Medicine

## 2021-03-25 DIAGNOSIS — Z1231 Encounter for screening mammogram for malignant neoplasm of breast: Secondary | ICD-10-CM

## 2021-03-25 IMAGING — MG DIGITAL SCREENING BILAT W/ TOMO W/ CAD
6 of 10 series · 6 of 30 positions shown · non-contrast
Comparison: Previous exam(s).

CLINICAL DATA: Screening.

EXAM:
DIGITAL SCREENING BILATERAL MAMMOGRAM WITH TOMO AND CAD

[L CC synth-2D]
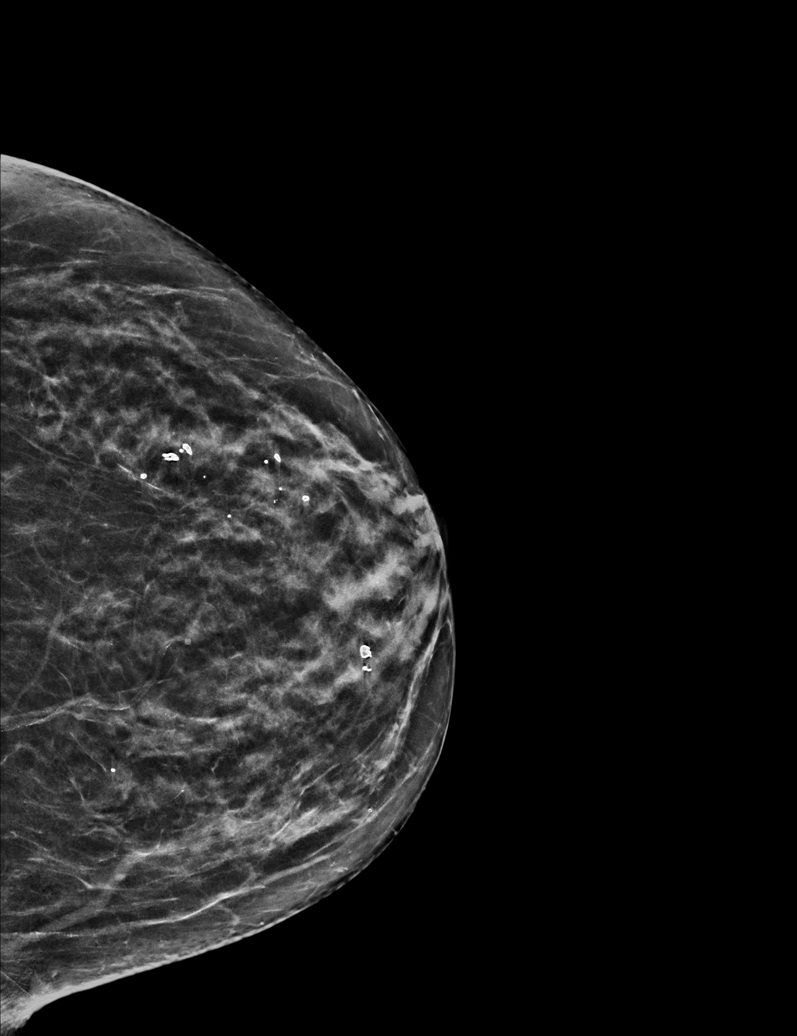

[R MLO synth-2D (1 of 2)]
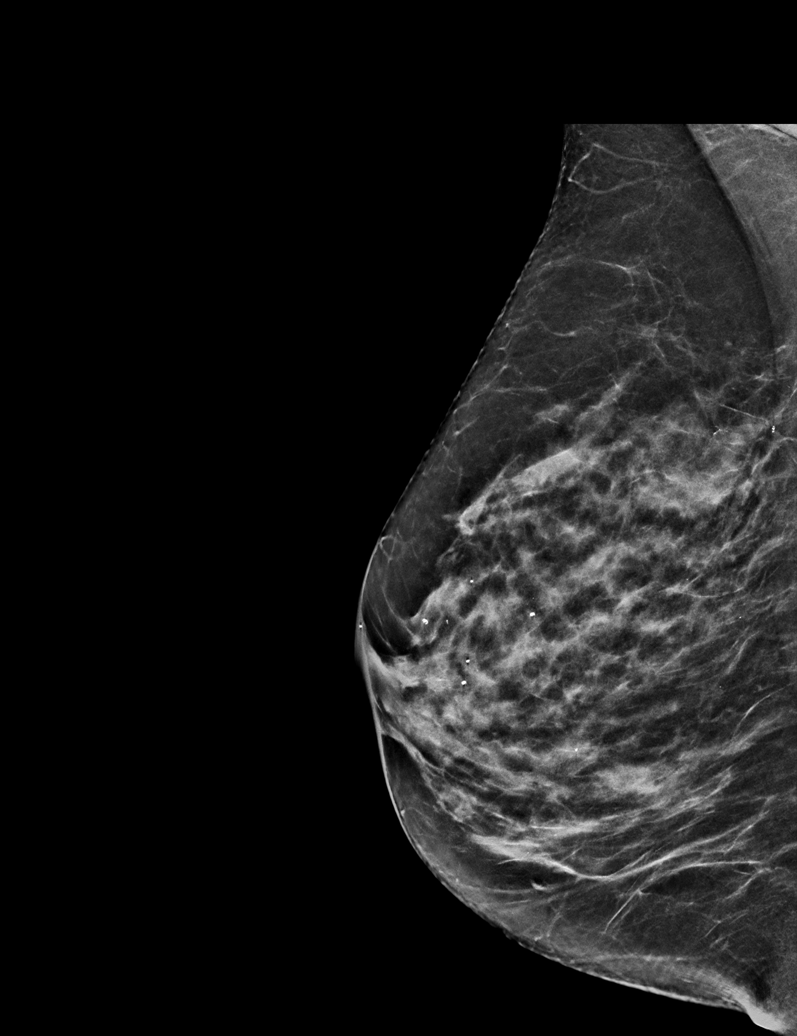

[L MLO synth-2D]
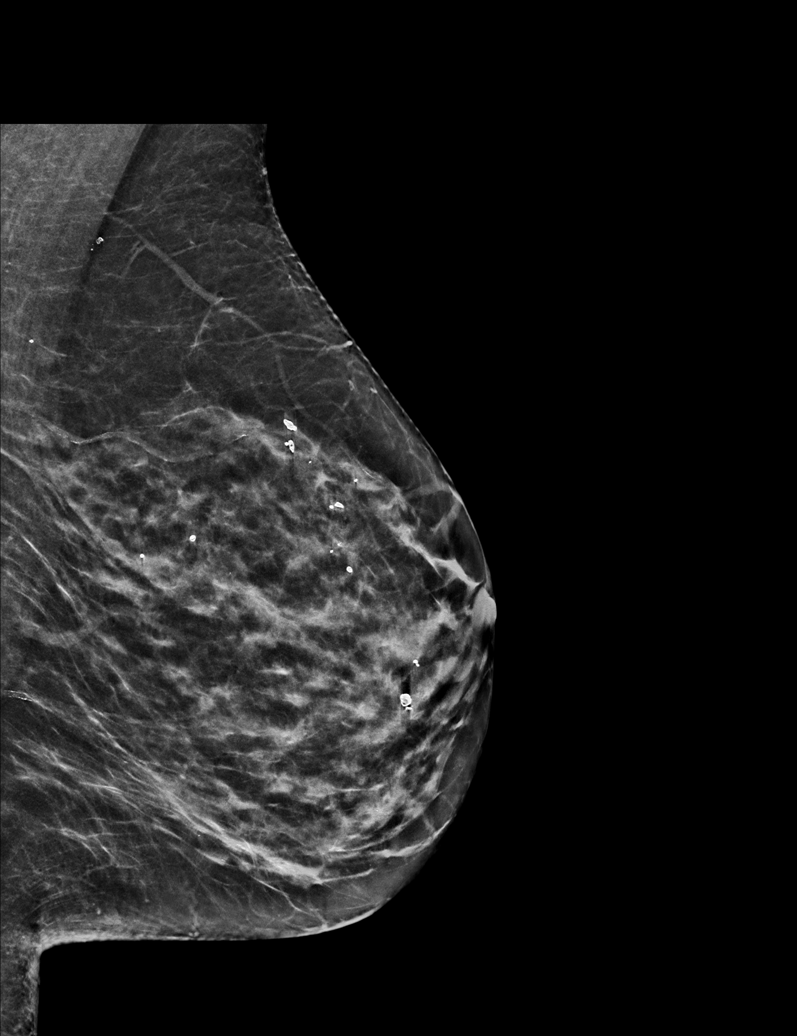

[R CC synth-2D]
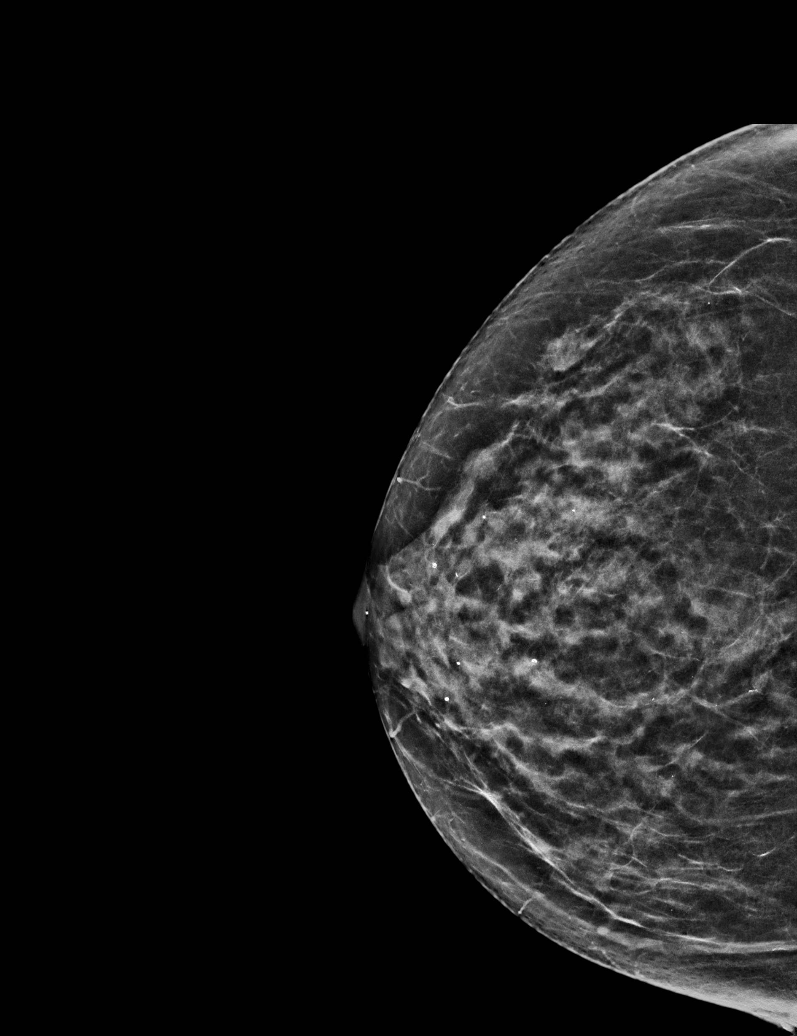

[R MLO synth-2D (2 of 2)]
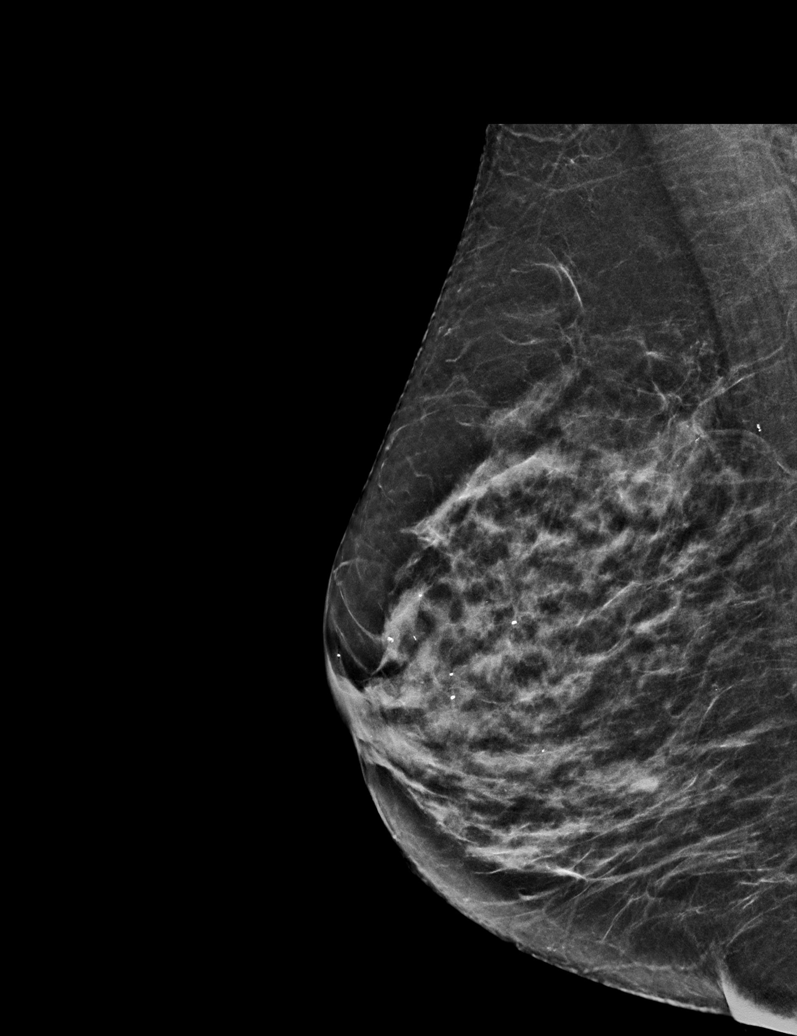

[R MLO tomo · tomo slice 29/56.0]
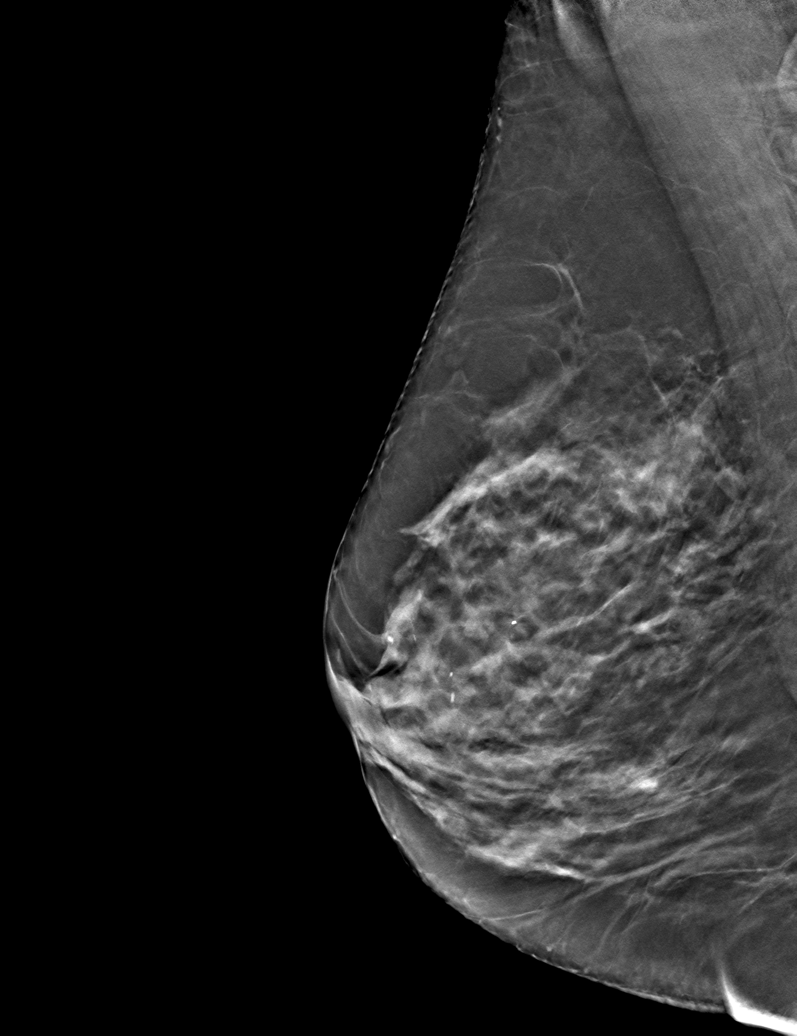

[6 of 30 positions shown; findings below may reference images not displayed]

ACR Breast Density Category c: The breast tissue is heterogeneously
dense, which may obscure small masses.
FINDINGS: There are no findings suspicious for malignancy. Images were
processed with CAD.
IMPRESSION: No mammographic evidence of malignancy. A result letter of this
screening mammogram will be mailed directly to the patient.

RECOMMENDATION:
Screening mammogram in one year. (Code:[id number]-[id number])

BI-RADS CATEGORY  1: Negative.

## 2021-04-21 DIAGNOSIS — M25511 Pain in right shoulder: Secondary | ICD-10-CM | POA: Diagnosis not present

## 2021-04-21 DIAGNOSIS — S83241A Other tear of medial meniscus, current injury, right knee, initial encounter: Secondary | ICD-10-CM | POA: Diagnosis not present

## 2021-04-21 DIAGNOSIS — M25561 Pain in right knee: Secondary | ICD-10-CM | POA: Diagnosis not present

## 2021-04-21 DIAGNOSIS — M84361A Stress fracture, right tibia, initial encounter for fracture: Secondary | ICD-10-CM | POA: Diagnosis not present

## 2021-05-02 DIAGNOSIS — S0502XA Injury of conjunctiva and corneal abrasion without foreign body, left eye, initial encounter: Secondary | ICD-10-CM | POA: Diagnosis not present

## 2021-05-02 DIAGNOSIS — H25813 Combined forms of age-related cataract, bilateral: Secondary | ICD-10-CM | POA: Diagnosis not present

## 2021-05-06 DIAGNOSIS — R2689 Other abnormalities of gait and mobility: Secondary | ICD-10-CM | POA: Diagnosis not present

## 2021-05-06 DIAGNOSIS — M79604 Pain in right leg: Secondary | ICD-10-CM | POA: Diagnosis not present

## 2021-05-11 DIAGNOSIS — M79604 Pain in right leg: Secondary | ICD-10-CM | POA: Diagnosis not present

## 2021-05-11 DIAGNOSIS — R2689 Other abnormalities of gait and mobility: Secondary | ICD-10-CM | POA: Diagnosis not present

## 2021-05-13 DIAGNOSIS — R2689 Other abnormalities of gait and mobility: Secondary | ICD-10-CM | POA: Diagnosis not present

## 2021-05-13 DIAGNOSIS — M79604 Pain in right leg: Secondary | ICD-10-CM | POA: Diagnosis not present

## 2021-05-23 DIAGNOSIS — R2689 Other abnormalities of gait and mobility: Secondary | ICD-10-CM | POA: Diagnosis not present

## 2021-05-23 DIAGNOSIS — M79604 Pain in right leg: Secondary | ICD-10-CM | POA: Diagnosis not present

## 2021-05-24 DIAGNOSIS — S83209A Unspecified tear of unspecified meniscus, current injury, unspecified knee, initial encounter: Secondary | ICD-10-CM | POA: Diagnosis not present

## 2021-05-24 DIAGNOSIS — M84351A Stress fracture, right femur, initial encounter for fracture: Secondary | ICD-10-CM | POA: Diagnosis not present

## 2021-05-25 DIAGNOSIS — L821 Other seborrheic keratosis: Secondary | ICD-10-CM | POA: Diagnosis not present

## 2021-05-25 DIAGNOSIS — L570 Actinic keratosis: Secondary | ICD-10-CM | POA: Diagnosis not present

## 2021-05-26 DIAGNOSIS — E785 Hyperlipidemia, unspecified: Secondary | ICD-10-CM | POA: Diagnosis not present

## 2021-05-26 DIAGNOSIS — D352 Benign neoplasm of pituitary gland: Secondary | ICD-10-CM | POA: Diagnosis not present

## 2021-05-26 DIAGNOSIS — I10 Essential (primary) hypertension: Secondary | ICD-10-CM | POA: Diagnosis not present

## 2021-05-26 DIAGNOSIS — Z79899 Other long term (current) drug therapy: Secondary | ICD-10-CM | POA: Diagnosis not present

## 2021-05-26 DIAGNOSIS — F32A Depression, unspecified: Secondary | ICD-10-CM | POA: Diagnosis not present

## 2021-05-31 DIAGNOSIS — I10 Essential (primary) hypertension: Secondary | ICD-10-CM | POA: Diagnosis not present

## 2021-05-31 DIAGNOSIS — E785 Hyperlipidemia, unspecified: Secondary | ICD-10-CM | POA: Diagnosis not present

## 2021-05-31 DIAGNOSIS — F325 Major depressive disorder, single episode, in full remission: Secondary | ICD-10-CM | POA: Diagnosis not present

## 2021-06-21 DIAGNOSIS — M84361A Stress fracture, right tibia, initial encounter for fracture: Secondary | ICD-10-CM | POA: Diagnosis not present

## 2021-06-21 DIAGNOSIS — M84351A Stress fracture, right femur, initial encounter for fracture: Secondary | ICD-10-CM | POA: Diagnosis not present

## 2021-08-16 DIAGNOSIS — M1711 Unilateral primary osteoarthritis, right knee: Secondary | ICD-10-CM | POA: Diagnosis not present

## 2021-09-27 ENCOUNTER — Other Ambulatory Visit (HOSPITAL_COMMUNITY): Payer: Self-pay | Admitting: Internal Medicine

## 2021-09-27 DIAGNOSIS — Z780 Asymptomatic menopausal state: Secondary | ICD-10-CM

## 2021-09-30 DIAGNOSIS — M25561 Pain in right knee: Secondary | ICD-10-CM | POA: Diagnosis not present

## 2021-09-30 DIAGNOSIS — M1711 Unilateral primary osteoarthritis, right knee: Secondary | ICD-10-CM | POA: Diagnosis not present

## 2021-10-13 ENCOUNTER — Ambulatory Visit (HOSPITAL_COMMUNITY)
Admission: RE | Admit: 2021-10-13 | Discharge: 2021-10-13 | Disposition: A | Discharge status: Home / Self Care | Payer: Medicare HMO | Source: Ambulatory Visit | Attending: Internal Medicine | Admitting: Internal Medicine

## 2021-10-13 DIAGNOSIS — M85852 Other specified disorders of bone density and structure, left thigh: Secondary | ICD-10-CM | POA: Diagnosis not present

## 2021-10-13 DIAGNOSIS — Z780 Asymptomatic menopausal state: Secondary | ICD-10-CM | POA: Insufficient documentation

## 2021-11-09 DIAGNOSIS — Z0100 Encounter for examination of eyes and vision without abnormal findings: Secondary | ICD-10-CM | POA: Diagnosis not present

## 2021-11-09 DIAGNOSIS — H524 Presbyopia: Secondary | ICD-10-CM | POA: Diagnosis not present

## 2021-11-15 DIAGNOSIS — M170 Bilateral primary osteoarthritis of knee: Secondary | ICD-10-CM | POA: Diagnosis not present

## 2021-11-15 DIAGNOSIS — M25562 Pain in left knee: Secondary | ICD-10-CM | POA: Diagnosis not present

## 2021-11-24 DIAGNOSIS — I10 Essential (primary) hypertension: Secondary | ICD-10-CM | POA: Diagnosis not present

## 2021-11-24 DIAGNOSIS — M1990 Unspecified osteoarthritis, unspecified site: Secondary | ICD-10-CM | POA: Diagnosis not present

## 2021-12-27 DIAGNOSIS — E876 Hypokalemia: Secondary | ICD-10-CM | POA: Diagnosis not present

## 2022-01-10 DIAGNOSIS — R69 Illness, unspecified: Secondary | ICD-10-CM | POA: Diagnosis not present

## 2022-01-24 DIAGNOSIS — L821 Other seborrheic keratosis: Secondary | ICD-10-CM | POA: Diagnosis not present

## 2022-01-24 DIAGNOSIS — D485 Neoplasm of uncertain behavior of skin: Secondary | ICD-10-CM | POA: Diagnosis not present

## 2022-01-24 DIAGNOSIS — L820 Inflamed seborrheic keratosis: Secondary | ICD-10-CM | POA: Diagnosis not present

## 2022-01-24 DIAGNOSIS — D225 Melanocytic nevi of trunk: Secondary | ICD-10-CM | POA: Diagnosis not present

## 2022-01-26 DIAGNOSIS — M1711 Unilateral primary osteoarthritis, right knee: Secondary | ICD-10-CM | POA: Diagnosis not present

## 2022-02-15 DIAGNOSIS — M1811 Unilateral primary osteoarthritis of first carpometacarpal joint, right hand: Secondary | ICD-10-CM | POA: Diagnosis not present

## 2022-02-16 DIAGNOSIS — H35373 Puckering of macula, bilateral: Secondary | ICD-10-CM | POA: Diagnosis not present

## 2022-02-16 DIAGNOSIS — H25813 Combined forms of age-related cataract, bilateral: Secondary | ICD-10-CM | POA: Diagnosis not present

## 2022-02-16 DIAGNOSIS — H43813 Vitreous degeneration, bilateral: Secondary | ICD-10-CM | POA: Diagnosis not present

## 2022-02-22 DIAGNOSIS — M1711 Unilateral primary osteoarthritis, right knee: Secondary | ICD-10-CM | POA: Diagnosis not present

## 2022-02-22 DIAGNOSIS — M25561 Pain in right knee: Secondary | ICD-10-CM | POA: Diagnosis not present

## 2022-02-22 DIAGNOSIS — M25562 Pain in left knee: Secondary | ICD-10-CM | POA: Diagnosis not present

## 2022-04-13 DIAGNOSIS — L821 Other seborrheic keratosis: Secondary | ICD-10-CM | POA: Diagnosis not present

## 2022-04-13 DIAGNOSIS — L814 Other melanin hyperpigmentation: Secondary | ICD-10-CM | POA: Diagnosis not present

## 2022-04-16 NOTE — Progress Notes (Unsigned)
New Patient Note  RE: Kristen Orr MRN: [id number] DOB: 1945/08/19 Date of Office Visit: 04/17/2022  Consult requested by: Kristen Arabian, MD Primary care provider: Asencion Noble, MD  Chief Complaint: No chief complaint on file.  History of Present Illness: I had the pleasure of seeing Kristen Orr for initial evaluation at the [facility] on 04/16/2022. She is a 77 y.o. 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  Patient is scheduled for *** replacement on ***. Patient had issues with ***.  No issues with watches, rings, bracelets, necklaces, belts, buttons on pants.   Prior joint replacements or metals in the body: ***.  Assessment and Plan: [name] is a 77 y.o. female with: No problem-specific Assessment & Plan notes found for this encounter.  No follow-ups on file.  No orders of the defined types were placed in this encounter.  Lab Orders  No laboratory test(s) ordered today    Other allergy screening: Asthma: {Blank single:19197::"yes","no"} Rhino conjunctivitis: {Blank single:19197::"yes","no"} Food allergy: {Blank single:19197::"yes","no"} Medication allergy: {Blank single:19197::"yes","no"} Hymenoptera allergy: {Blank single:19197::"yes","no"} Urticaria: {Blank single:19197::"yes","no"} Eczema:{Blank single:19197::"yes","no"} History of recurrent infections suggestive of immunodeficency: {Blank single:19197::"yes","no"}  Diagnostics: Spirometry:  Tracings reviewed. Her effort: {Blank single:19197::"Good reproducible efforts.","It was hard to get consistent efforts and there is a question as to whether this reflects a maximal maneuver.","Poor effort, data can not be interpreted."} FVC: ***L FEV1: ***L, ***% predicted FEV1/FVC ratio: ***% Interpretation: {Blank single:19197::"Spirometry consistent with mild obstructive disease","Spirometry consistent with moderate obstructive  disease","Spirometry consistent with severe obstructive disease","Spirometry consistent with possible restrictive disease","Spirometry consistent with mixed obstructive and restrictive disease","Spirometry uninterpretable due to technique","Spirometry consistent with normal pattern","No overt abnormalities noted given today's efforts"}.  Please see scanned spirometry results for details.  Skin Testing: {Blank single:19197::"Select foods","Environmental allergy panel","Environmental allergy panel and select foods","Food allergy panel","None","Deferred due to recent antihistamines use"}. *** Results discussed with patient/family.   Past Medical History: Patient Active Problem List   Diagnosis Date Noted  . Chronic idiopathic urticaria 08/08/2016  . Full body hives 08/20/2015  . Angioedema 08/20/2015  . Encounter for counseling 04/21/2014  . Superficial burn of right hand 03/27/2014  . Varicose veins of lower extremities with other complications 40/98/1191  . Osteopenia 11/14/2011  . Postmenopausal HRT (hormone replacement therapy) 11/14/2011   Past Medical History:  Diagnosis Date  . Allergy   . Hyperlipidemia   . Hypertension   . Varicose veins    Past Surgical History: Past Surgical History:  Procedure Laterality Date  . PITUITARY SURGERY    . TONSILLECTOMY    . VARICOSE VEIN SURGERY     Medication List:  Current Outpatient Medications  Medication Sig Dispense Refill  . Ascorbic Acid (VITAMIN C PO) Take 1 tablet by mouth 2 (two) times daily.    . B Complex Vitamins (VITAMIN B COMPLEX PO) Take 1 tablet by mouth daily.    Marland Kitchen buPROPion (WELLBUTRIN XL) 150 MG 24 hr tablet     . chlorthalidone (HYGROTON) 25 MG tablet     . citalopram (CELEXA) 20 MG tablet Take 20 mg by mouth daily.    Marland Kitchen ezetimibe (ZETIA) 10 MG tablet     . fexofenadine (ALLEGRA) 180 MG tablet Take 180 mg by mouth daily as needed for allergies or rhinitis.    Marland Kitchen ibuprofen (ADVIL,MOTRIN) 200 MG tablet Take 200 mg  by mouth every 6 (six) hours as needed for moderate pain.    Marland Kitchen irbesartan (AVAPRO) 150 MG  tablet     . silver sulfADIAZINE (SILVADENE) 1 % cream Apply 1 application topically daily. 50 g 0  . VAGIFEM 10 MCG TABS vaginal tablet Take 1 tablet by mouth 2 (two) times a week.      No current facility-administered medications for this visit.   Allergies: Allergies  Allergen Reactions  . Tape Hives  . Eggs Or Egg-Derived Products   . Penicillins Rash, Hives and Other (See Comments)   Social History: Social History   Socioeconomic History  . Marital status: Married    Spouse name: Not on file  . Number of children: Not on file  . Years of education: Not on file  . Highest education level: Not on file  Occupational History  . Not on file  Tobacco Use  . Smoking status: Former    Types: Cigarettes    Quit date: 04/29/1976    Years since quitting: 45.9  . Smokeless tobacco: Never  Vaping Use  . Vaping Use: Never used  Substance and Sexual Activity  . Alcohol use: Yes  . Drug use: No  . Sexual activity: Not on file  Other Topics Concern  . Not on file  Social History Narrative  . Not on file   Social Determinants of Health   Financial Resource Strain: Not on file  Food Insecurity: Not on file  Transportation Needs: Not on file  Physical Activity: Not on file  Stress: Not on file  Social Connections: Not on file   Lives in a ***. Smoking: *** Occupation: ***  Environmental HistoryFreight forwarder in the house: Estate agent in the family room: {Blank single:19197::"yes","no"} Carpet in the bedroom: {Blank single:19197::"yes","no"} Heating: {Blank single:19197::"electric","gas","heat pump"} Cooling: {Blank single:19197::"central","window","heat pump"} Pet: {Blank single:19197::"yes ***","no"}  Family History: Family History  Problem Relation Age of Onset  . Cancer Mother   . Hyperlipidemia Mother   . Hypertension Mother   . Cancer  Father   . Deep vein thrombosis Sister    Problem                               Relation Asthma                                   *** Eczema                                *** Food allergy                          *** Allergic rhino conjunctivitis     ***  Review of Systems  Constitutional:  Negative for appetite change, chills, fever and unexpected weight change.  HENT:  Negative for congestion and rhinorrhea.   Eyes:  Negative for itching.  Respiratory:  Negative for cough, chest tightness, shortness of breath and wheezing.   Cardiovascular:  Negative for chest pain.  Gastrointestinal:  Negative for abdominal pain.  Genitourinary:  Negative for difficulty urinating.  Skin:  Negative for rash.  Neurological:  Negative for headaches.   Objective: There were no vitals taken for this visit. There is no height or weight on file to calculate BMI. Physical Exam Vitals and nursing note reviewed.  Constitutional:      Appearance: Normal appearance. She is well-developed.  HENT:     Head: Normocephalic and atraumatic.     Right Ear: Tympanic membrane and external ear normal.     Left Ear: Tympanic membrane and external ear normal.     Nose: Nose normal.     Mouth/Throat:     Mouth: Mucous membranes are moist.     Pharynx: Oropharynx is clear.  Eyes:     Conjunctiva/sclera: Conjunctivae normal.  Cardiovascular:     Rate and Rhythm: Normal rate and regular rhythm.     Heart sounds: Normal heart sounds. No murmur heard.    No friction rub. No gallop.  Pulmonary:     Effort: Pulmonary effort is normal.     Breath sounds: Normal breath sounds. No wheezing, rhonchi or rales.  Musculoskeletal:     Cervical back: Neck supple.  Skin:    General: Skin is warm.     Findings: No rash.  Neurological:     Mental Status: She is alert and oriented to person, place, and time.  Psychiatric:        Behavior: Behavior normal.  The plan was reviewed with the patient/family, and all  questions/concerned were addressed.  It was my pleasure to see Kristen Orr today and participate in her care. Please feel free to contact me with any questions or concerns.  Sincerely,  Rexene Alberts, DO Allergy & Immunology  [facility] office: [place] office: [phone]

## 2022-04-17 ENCOUNTER — Encounter: Payer: Self-pay | Admitting: Allergy

## 2022-04-17 ENCOUNTER — Other Ambulatory Visit: Payer: Self-pay

## 2022-04-17 ENCOUNTER — Ambulatory Visit: Payer: Medicare HMO | Admitting: Allergy

## 2022-04-17 VITALS — BP 124/86 | HR 85 | Temp 97.4°F | Resp 18 | Ht 64.0 in | Wt 143.9 lb

## 2022-04-17 DIAGNOSIS — L2389 Allergic contact dermatitis due to other agents: Secondary | ICD-10-CM

## 2022-04-17 DIAGNOSIS — J3089 Other allergic rhinitis: Secondary | ICD-10-CM | POA: Diagnosis not present

## 2022-04-17 NOTE — Patient Instructions (Addendum)
Discussed with patient that patch testing tests for contact dermatitis and sometimes it does not correlate to how one will react to metals in the body. Positive patch testing results can help in avoiding those items however it is possible to get false negative results.  Nevertheless, this is the most accessible test for metal sensitivity currently available.   Return for metal patch testing - schedule for Feb 12th week  Instructions for patch testing:  Please avoid strenuous physical activities and do not get the patches on the back wet. No showering until final patch reading done. Okay to take antihistamines for itching but avoid placing any creams on the back where the patches are. We will remove the patches on Wednesday and will do our initial read. Then you will come back on Friday and the following Monday.

## 2022-04-17 NOTE — Assessment & Plan Note (Signed)
Nickel jewelry causes pruritic rash. Last exposure was 1 month ago. No issues with gold. Scheduled for right knee replacement in March. No prior joint replacements. Discussed with patient that patch testing tests for contact dermatitis and sometimes it does not correlate to how one will react to metals in the body. Positive patch testing results can help in avoiding those items however it is possible to get false negative results.  Nevertheless, this is the most accessible test for metal sensitivity currently available.  Return for metal patch testing - schedule for Feb 12th week  Instructions for patch testing:  Please avoid strenuous physical activities and do not get the patches on the back wet. No showering until final patch reading done. Okay to take antihistamines for itching but avoid placing any creams on the back where the patches are. We will remove the patches on Wednesday and will do our initial read. Then you will come back on Friday and the following Monday.

## 2022-04-17 NOTE — Assessment & Plan Note (Signed)
Minimal perennial symptoms which flare sin the spring and fall. Takes OTC antihistamines prn with good benefit. Monitor symptoms. Use over the counter antihistamines such as Zyrtec (cetirizine), Claritin (loratadine), Allegra (fexofenadine), or Xyzal (levocetirizine) daily as needed. May switch antihistamines every few months.

## 2022-04-18 ENCOUNTER — Other Ambulatory Visit: Payer: Self-pay | Admitting: Internal Medicine

## 2022-04-18 DIAGNOSIS — Z1231 Encounter for screening mammogram for malignant neoplasm of breast: Secondary | ICD-10-CM

## 2022-04-19 ENCOUNTER — Encounter: Payer: Self-pay | Admitting: Allergy

## 2022-04-21 ENCOUNTER — Encounter: Payer: Self-pay | Admitting: Internal Medicine

## 2022-05-01 ENCOUNTER — Ambulatory Visit: Payer: Medicare HMO | Admitting: Allergy

## 2022-05-02 DIAGNOSIS — M1711 Unilateral primary osteoarthritis, right knee: Secondary | ICD-10-CM | POA: Diagnosis not present

## 2022-05-03 ENCOUNTER — Encounter: Payer: Medicare HMO | Admitting: Allergy

## 2022-05-05 ENCOUNTER — Encounter: Payer: Medicare HMO | Admitting: Family Medicine

## 2022-05-05 ENCOUNTER — Ambulatory Visit (HOSPITAL_COMMUNITY): Payer: Medicare HMO

## 2022-05-08 ENCOUNTER — Encounter: Payer: Medicare HMO | Admitting: Allergy

## 2022-05-10 ENCOUNTER — Ambulatory Visit (HOSPITAL_COMMUNITY)
Admission: RE | Admit: 2022-05-10 | Discharge: 2022-05-10 | Disposition: A | Discharge status: Home / Self Care | Payer: Medicare HMO | Source: Ambulatory Visit | Attending: Internal Medicine | Admitting: Internal Medicine

## 2022-05-10 ENCOUNTER — Encounter (HOSPITAL_COMMUNITY): Payer: Self-pay

## 2022-05-10 DIAGNOSIS — Z1231 Encounter for screening mammogram for malignant neoplasm of breast: Secondary | ICD-10-CM

## 2022-05-15 ENCOUNTER — Other Ambulatory Visit (HOSPITAL_COMMUNITY): Payer: Self-pay | Admitting: Internal Medicine

## 2022-05-15 DIAGNOSIS — R928 Other abnormal and inconclusive findings on diagnostic imaging of breast: Secondary | ICD-10-CM

## 2022-05-16 ENCOUNTER — Ambulatory Visit (HOSPITAL_COMMUNITY)
Admission: RE | Admit: 2022-05-16 | Discharge: 2022-05-16 | Disposition: A | Discharge status: Home / Self Care | Payer: Medicare HMO | Source: Ambulatory Visit | Attending: Internal Medicine | Admitting: Internal Medicine

## 2022-05-16 ENCOUNTER — Encounter (HOSPITAL_COMMUNITY): Payer: Self-pay

## 2022-05-16 DIAGNOSIS — R928 Other abnormal and inconclusive findings on diagnostic imaging of breast: Secondary | ICD-10-CM | POA: Diagnosis not present

## 2022-05-16 DIAGNOSIS — R922 Inconclusive mammogram: Secondary | ICD-10-CM | POA: Diagnosis not present

## 2022-05-17 DIAGNOSIS — L821 Other seborrheic keratosis: Secondary | ICD-10-CM | POA: Diagnosis not present

## 2022-05-18 DIAGNOSIS — E785 Hyperlipidemia, unspecified: Secondary | ICD-10-CM | POA: Diagnosis not present

## 2022-05-18 DIAGNOSIS — H524 Presbyopia: Secondary | ICD-10-CM | POA: Diagnosis not present

## 2022-05-18 DIAGNOSIS — F329 Major depressive disorder, single episode, unspecified: Secondary | ICD-10-CM | POA: Diagnosis not present

## 2022-05-18 DIAGNOSIS — Z79899 Other long term (current) drug therapy: Secondary | ICD-10-CM | POA: Diagnosis not present

## 2022-05-18 DIAGNOSIS — Z0100 Encounter for examination of eyes and vision without abnormal findings: Secondary | ICD-10-CM | POA: Diagnosis not present

## 2022-05-18 DIAGNOSIS — I10 Essential (primary) hypertension: Secondary | ICD-10-CM | POA: Diagnosis not present

## 2022-05-31 DIAGNOSIS — G8918 Other acute postprocedural pain: Secondary | ICD-10-CM | POA: Diagnosis not present

## 2022-05-31 DIAGNOSIS — M1711 Unilateral primary osteoarthritis, right knee: Secondary | ICD-10-CM | POA: Diagnosis not present

## 2022-06-07 DIAGNOSIS — R2689 Other abnormalities of gait and mobility: Secondary | ICD-10-CM | POA: Diagnosis not present

## 2022-06-07 DIAGNOSIS — M6281 Muscle weakness (generalized): Secondary | ICD-10-CM | POA: Diagnosis not present

## 2022-06-07 DIAGNOSIS — Z96651 Presence of right artificial knee joint: Secondary | ICD-10-CM | POA: Diagnosis not present

## 2022-06-07 DIAGNOSIS — Z471 Aftercare following joint replacement surgery: Secondary | ICD-10-CM | POA: Diagnosis not present

## 2022-06-08 DIAGNOSIS — Z96651 Presence of right artificial knee joint: Secondary | ICD-10-CM | POA: Diagnosis not present

## 2022-06-08 DIAGNOSIS — R2689 Other abnormalities of gait and mobility: Secondary | ICD-10-CM | POA: Diagnosis not present

## 2022-06-08 DIAGNOSIS — Z471 Aftercare following joint replacement surgery: Secondary | ICD-10-CM | POA: Diagnosis not present

## 2022-06-12 DIAGNOSIS — Z96651 Presence of right artificial knee joint: Secondary | ICD-10-CM | POA: Diagnosis not present

## 2022-06-12 DIAGNOSIS — R2689 Other abnormalities of gait and mobility: Secondary | ICD-10-CM | POA: Diagnosis not present

## 2022-06-12 DIAGNOSIS — Z471 Aftercare following joint replacement surgery: Secondary | ICD-10-CM | POA: Diagnosis not present

## 2022-06-15 DIAGNOSIS — M6281 Muscle weakness (generalized): Secondary | ICD-10-CM | POA: Diagnosis not present

## 2022-06-15 DIAGNOSIS — Z96651 Presence of right artificial knee joint: Secondary | ICD-10-CM | POA: Diagnosis not present

## 2022-06-15 DIAGNOSIS — Z471 Aftercare following joint replacement surgery: Secondary | ICD-10-CM | POA: Diagnosis not present

## 2022-06-15 DIAGNOSIS — R2689 Other abnormalities of gait and mobility: Secondary | ICD-10-CM | POA: Diagnosis not present

## 2022-06-19 DIAGNOSIS — Z96651 Presence of right artificial knee joint: Secondary | ICD-10-CM | POA: Diagnosis not present

## 2022-06-19 DIAGNOSIS — R2689 Other abnormalities of gait and mobility: Secondary | ICD-10-CM | POA: Diagnosis not present

## 2022-06-19 DIAGNOSIS — M6281 Muscle weakness (generalized): Secondary | ICD-10-CM | POA: Diagnosis not present

## 2022-06-19 DIAGNOSIS — Z471 Aftercare following joint replacement surgery: Secondary | ICD-10-CM | POA: Diagnosis not present

## 2022-06-22 DIAGNOSIS — R2689 Other abnormalities of gait and mobility: Secondary | ICD-10-CM | POA: Diagnosis not present

## 2022-06-22 DIAGNOSIS — Z471 Aftercare following joint replacement surgery: Secondary | ICD-10-CM | POA: Diagnosis not present

## 2022-06-22 DIAGNOSIS — Z96651 Presence of right artificial knee joint: Secondary | ICD-10-CM | POA: Diagnosis not present

## 2022-06-22 DIAGNOSIS — M6281 Muscle weakness (generalized): Secondary | ICD-10-CM | POA: Diagnosis not present

## 2022-06-26 DIAGNOSIS — H903 Sensorineural hearing loss, bilateral: Secondary | ICD-10-CM | POA: Diagnosis not present

## 2022-06-26 DIAGNOSIS — H6123 Impacted cerumen, bilateral: Secondary | ICD-10-CM | POA: Diagnosis not present

## 2022-06-27 DIAGNOSIS — Z96651 Presence of right artificial knee joint: Secondary | ICD-10-CM | POA: Diagnosis not present

## 2022-06-27 DIAGNOSIS — R2689 Other abnormalities of gait and mobility: Secondary | ICD-10-CM | POA: Diagnosis not present

## 2022-06-27 DIAGNOSIS — M6281 Muscle weakness (generalized): Secondary | ICD-10-CM | POA: Diagnosis not present

## 2022-06-27 DIAGNOSIS — Z471 Aftercare following joint replacement surgery: Secondary | ICD-10-CM | POA: Diagnosis not present

## 2022-06-29 DIAGNOSIS — Z96651 Presence of right artificial knee joint: Secondary | ICD-10-CM | POA: Diagnosis not present

## 2022-06-29 DIAGNOSIS — M6281 Muscle weakness (generalized): Secondary | ICD-10-CM | POA: Diagnosis not present

## 2022-06-29 DIAGNOSIS — R2689 Other abnormalities of gait and mobility: Secondary | ICD-10-CM | POA: Diagnosis not present

## 2022-06-29 DIAGNOSIS — Z471 Aftercare following joint replacement surgery: Secondary | ICD-10-CM | POA: Diagnosis not present

## 2022-07-03 DIAGNOSIS — Z471 Aftercare following joint replacement surgery: Secondary | ICD-10-CM | POA: Diagnosis not present

## 2022-07-03 DIAGNOSIS — M6281 Muscle weakness (generalized): Secondary | ICD-10-CM | POA: Diagnosis not present

## 2022-07-03 DIAGNOSIS — R2689 Other abnormalities of gait and mobility: Secondary | ICD-10-CM | POA: Diagnosis not present

## 2022-07-03 DIAGNOSIS — Z96651 Presence of right artificial knee joint: Secondary | ICD-10-CM | POA: Diagnosis not present

## 2022-07-06 DIAGNOSIS — M6281 Muscle weakness (generalized): Secondary | ICD-10-CM | POA: Diagnosis not present

## 2022-07-06 DIAGNOSIS — Z96651 Presence of right artificial knee joint: Secondary | ICD-10-CM | POA: Diagnosis not present

## 2022-07-06 DIAGNOSIS — Z471 Aftercare following joint replacement surgery: Secondary | ICD-10-CM | POA: Diagnosis not present

## 2022-07-06 DIAGNOSIS — R2689 Other abnormalities of gait and mobility: Secondary | ICD-10-CM | POA: Diagnosis not present

## 2022-07-07 DIAGNOSIS — Z4731 Aftercare following explantation of shoulder joint prosthesis: Secondary | ICD-10-CM | POA: Diagnosis not present

## 2022-07-10 DIAGNOSIS — Z471 Aftercare following joint replacement surgery: Secondary | ICD-10-CM | POA: Diagnosis not present

## 2022-07-10 DIAGNOSIS — R2689 Other abnormalities of gait and mobility: Secondary | ICD-10-CM | POA: Diagnosis not present

## 2022-07-10 DIAGNOSIS — M6281 Muscle weakness (generalized): Secondary | ICD-10-CM | POA: Diagnosis not present

## 2022-07-10 DIAGNOSIS — Z96651 Presence of right artificial knee joint: Secondary | ICD-10-CM | POA: Diagnosis not present

## 2022-07-13 DIAGNOSIS — M6281 Muscle weakness (generalized): Secondary | ICD-10-CM | POA: Diagnosis not present

## 2022-07-13 DIAGNOSIS — Z471 Aftercare following joint replacement surgery: Secondary | ICD-10-CM | POA: Diagnosis not present

## 2022-07-13 DIAGNOSIS — Z96651 Presence of right artificial knee joint: Secondary | ICD-10-CM | POA: Diagnosis not present

## 2022-07-13 DIAGNOSIS — R2689 Other abnormalities of gait and mobility: Secondary | ICD-10-CM | POA: Diagnosis not present

## 2022-10-03 DIAGNOSIS — Z1231 Encounter for screening mammogram for malignant neoplasm of breast: Secondary | ICD-10-CM | POA: Diagnosis not present

## 2022-10-03 DIAGNOSIS — N952 Postmenopausal atrophic vaginitis: Secondary | ICD-10-CM | POA: Diagnosis not present

## 2022-11-27 DIAGNOSIS — I10 Essential (primary) hypertension: Secondary | ICD-10-CM | POA: Diagnosis not present

## 2022-11-27 DIAGNOSIS — F325 Major depressive disorder, single episode, in full remission: Secondary | ICD-10-CM | POA: Diagnosis not present

## 2022-12-04 DIAGNOSIS — L400 Psoriasis vulgaris: Secondary | ICD-10-CM | POA: Diagnosis not present

## 2023-01-06 ENCOUNTER — Emergency Department (HOSPITAL_COMMUNITY): Payer: Medicare HMO

## 2023-01-06 ENCOUNTER — Encounter (HOSPITAL_COMMUNITY): Payer: Self-pay | Admitting: Emergency Medicine

## 2023-01-06 ENCOUNTER — Inpatient Hospital Stay (HOSPITAL_COMMUNITY)
Admission: EM | Admit: 2023-01-06 | Discharge: 2023-01-08 | DRG: 522 | Disposition: A | Discharge status: Home / Self Care | Payer: Medicare HMO | Attending: Obstetrics and Gynecology | Admitting: Obstetrics and Gynecology

## 2023-01-06 DIAGNOSIS — Z9109 Other allergy status, other than to drugs and biological substances: Secondary | ICD-10-CM | POA: Diagnosis not present

## 2023-01-06 DIAGNOSIS — Z8249 Family history of ischemic heart disease and other diseases of the circulatory system: Secondary | ICD-10-CM

## 2023-01-06 DIAGNOSIS — W19XXXA Unspecified fall, initial encounter: Secondary | ICD-10-CM | POA: Diagnosis not present

## 2023-01-06 DIAGNOSIS — W1809XA Striking against other object with subsequent fall, initial encounter: Secondary | ICD-10-CM | POA: Diagnosis present

## 2023-01-06 DIAGNOSIS — E876 Hypokalemia: Secondary | ICD-10-CM

## 2023-01-06 DIAGNOSIS — F32A Depression, unspecified: Secondary | ICD-10-CM | POA: Diagnosis present

## 2023-01-06 DIAGNOSIS — M25551 Pain in right hip: Secondary | ICD-10-CM | POA: Diagnosis present

## 2023-01-06 DIAGNOSIS — E871 Hypo-osmolality and hyponatremia: Secondary | ICD-10-CM | POA: Diagnosis present

## 2023-01-06 DIAGNOSIS — Z96651 Presence of right artificial knee joint: Secondary | ICD-10-CM | POA: Diagnosis present

## 2023-01-06 DIAGNOSIS — M810 Age-related osteoporosis without current pathological fracture: Secondary | ICD-10-CM | POA: Diagnosis not present

## 2023-01-06 DIAGNOSIS — I10 Essential (primary) hypertension: Secondary | ICD-10-CM | POA: Diagnosis not present

## 2023-01-06 DIAGNOSIS — Z880 Allergy status to penicillin: Secondary | ICD-10-CM

## 2023-01-06 DIAGNOSIS — R339 Retention of urine, unspecified: Secondary | ICD-10-CM | POA: Diagnosis present

## 2023-01-06 DIAGNOSIS — E785 Hyperlipidemia, unspecified: Secondary | ICD-10-CM | POA: Diagnosis present

## 2023-01-06 DIAGNOSIS — S72001A Fracture of unspecified part of neck of right femur, initial encounter for closed fracture: Secondary | ICD-10-CM | POA: Diagnosis not present

## 2023-01-06 DIAGNOSIS — Z96641 Presence of right artificial hip joint: Secondary | ICD-10-CM | POA: Diagnosis not present

## 2023-01-06 DIAGNOSIS — S72009A Fracture of unspecified part of neck of unspecified femur, initial encounter for closed fracture: Principal | ICD-10-CM | POA: Diagnosis present

## 2023-01-06 DIAGNOSIS — S72041A Displaced fracture of base of neck of right femur, initial encounter for closed fracture: Secondary | ICD-10-CM | POA: Diagnosis not present

## 2023-01-06 DIAGNOSIS — Z79899 Other long term (current) drug therapy: Secondary | ICD-10-CM | POA: Diagnosis not present

## 2023-01-06 DIAGNOSIS — D72829 Elevated white blood cell count, unspecified: Secondary | ICD-10-CM | POA: Diagnosis present

## 2023-01-06 DIAGNOSIS — S72011A Unspecified intracapsular fracture of right femur, initial encounter for closed fracture: Secondary | ICD-10-CM | POA: Diagnosis not present

## 2023-01-06 DIAGNOSIS — Y92009 Unspecified place in unspecified non-institutional (private) residence as the place of occurrence of the external cause: Secondary | ICD-10-CM

## 2023-01-06 DIAGNOSIS — Z83438 Family history of other disorder of lipoprotein metabolism and other lipidemia: Secondary | ICD-10-CM | POA: Diagnosis not present

## 2023-01-06 DIAGNOSIS — Z87891 Personal history of nicotine dependence: Secondary | ICD-10-CM | POA: Diagnosis not present

## 2023-01-06 DIAGNOSIS — Z91012 Allergy to eggs: Secondary | ICD-10-CM

## 2023-01-06 LAB — CBC WITH DIFFERENTIAL/PLATELET
Abs Immature Granulocytes: 0.11 10*3/uL — ABNORMAL HIGH (ref 0.00–0.07)
Basophils Absolute: 0.1 10*3/uL (ref 0.0–0.1)
Basophils Relative: 1 %
Eosinophils Absolute: 0.1 10*3/uL (ref 0.0–0.5)
Eosinophils Relative: 1 %
HCT: 39.8 % (ref 36.0–46.0)
Hemoglobin: 13.3 g/dL (ref 12.0–15.0)
Immature Granulocytes: 1 %
Lymphocytes Relative: 10 %
Lymphs Abs: 1.5 10*3/uL (ref 0.7–4.0)
MCH: 29.7 pg (ref 26.0–34.0)
MCHC: 33.4 g/dL (ref 30.0–36.0)
MCV: 88.8 fL (ref 80.0–100.0)
Monocytes Absolute: 0.7 10*3/uL (ref 0.1–1.0)
Monocytes Relative: 5 %
Neutro Abs: 12.7 10*3/uL — ABNORMAL HIGH (ref 1.7–7.7)
Neutrophils Relative %: 82 %
Platelets: 294 10*3/uL (ref 150–400)
RBC: 4.48 MIL/uL (ref 3.87–5.11)
RDW: 13 % (ref 11.5–15.5)
WBC: 15.3 10*3/uL — ABNORMAL HIGH (ref 4.0–10.5)
nRBC: 0 % (ref 0.0–0.2)

## 2023-01-06 LAB — PROTIME-INR
INR: 1 (ref 0.8–1.2)
Prothrombin Time: 13.4 s (ref 11.4–15.2)

## 2023-01-06 LAB — BASIC METABOLIC PANEL
Anion gap: 12 (ref 5–15)
BUN: 23 mg/dL (ref 8–23)
CO2: 24 mmol/L (ref 22–32)
Calcium: 9 mg/dL (ref 8.9–10.3)
Chloride: 95 mmol/L — ABNORMAL LOW (ref 98–111)
Creatinine, Ser: 0.74 mg/dL (ref 0.44–1.00)
GFR, Estimated: >60 mL/min (ref >60)
Glucose, Bld: 90 mg/dL (ref 70–99)
Potassium: 3.1 mmol/L — ABNORMAL LOW (ref 3.5–5.1)
Sodium: 131 mmol/L — ABNORMAL LOW (ref 135–145)

## 2023-01-06 LAB — TYPE AND SCREEN
ABO/RH(D): A POS
Antibody Screen: NEGATIVE

## 2023-01-06 MED ORDER — MORPHINE SULFATE (PF) 4 MG/ML IV SOLN
4.0000 mg | INTRAVENOUS | Status: DC | PRN
  Administered 2023-01-06: 4 mg via INTRAVENOUS
  Filled 2023-01-06: qty 1

## 2023-01-06 MED ORDER — HYDROCODONE-ACETAMINOPHEN 5-325 MG PO TABS
1.0000 | ORAL_TABLET | Freq: Four times a day (QID) | ORAL | Status: DC | PRN
Start: 2023-01-06 — End: 2023-01-07
  Administered 2023-01-06: 1 via ORAL
  Filled 2023-01-06: qty 1

## 2023-01-06 MED ORDER — MORPHINE SULFATE (PF) 2 MG/ML IV SOLN: 0.5000 mg | INTRAVENOUS | Status: DC | PRN

## 2023-01-06 MED ORDER — SODIUM CHLORIDE 0.9 % IV BOLUS
500.0000 mL | Freq: Once | INTRAVENOUS | Status: AC
  Administered 2023-01-06: 500 mL via INTRAVENOUS

## 2023-01-06 MED ORDER — ONDANSETRON HCL 4 MG/2ML IJ SOLN
4.0000 mg | Freq: Once | INTRAMUSCULAR | Status: AC
  Administered 2023-01-06: 4 mg via INTRAVENOUS
  Filled 2023-01-06: qty 2

## 2023-01-06 MED ORDER — INFLUENZA VAC A&B SURF ANT ADJ 0.5 ML IM SUSY
0.5000 mL | PREFILLED_SYRINGE | INTRAMUSCULAR | Status: DC
  Filled 2023-01-06: qty 0.5

## 2023-01-06 MED ORDER — POTASSIUM CHLORIDE CRYS ER 20 MEQ PO TBCR
40.0000 meq | EXTENDED_RELEASE_TABLET | Freq: Once | ORAL | Status: AC
  Administered 2023-01-06: 40 meq via ORAL
  Filled 2023-01-06: qty 2

## 2023-01-06 MED ORDER — ENSURE PRE-SURGERY PO LIQD
296.0000 mL | Freq: Once | ORAL | Status: AC
  Administered 2023-01-06: 296 mL via ORAL
  Filled 2023-01-06: qty 296

## 2023-01-06 MED ORDER — OXYCODONE-ACETAMINOPHEN 5-325 MG PO TABS
1.0000 | ORAL_TABLET | Freq: Once | ORAL | Status: AC
  Administered 2023-01-06: 1 via ORAL
  Filled 2023-01-06: qty 1

## 2023-01-06 MED ORDER — MORPHINE SULFATE (PF) 2 MG/ML IV SOLN
1.0000 mg | INTRAVENOUS | Status: DC | PRN
  Administered 2023-01-07: 1 mg via INTRAVENOUS
  Filled 2023-01-06: qty 1

## 2023-01-06 MED ORDER — SENNA 8.6 MG PO TABS
1.0000 | ORAL_TABLET | Freq: Two times a day (BID) | ORAL | Status: DC
  Administered 2023-01-06 – 2023-01-08 (×4): 8.6 mg via ORAL
  Filled 2023-01-06 (×4): qty 1

## 2023-01-06 NOTE — ED Provider Triage Note (Signed)
Emergency Medicine Provider Triage Evaluation Note  Kristen Orr , a 77 y.o. female  was evaluated in triage.  Pt complains of right hip pain after tripping over vacuum. Fell onto right hip and has not been able to bear weight since then. No head trauma, neck pain, back pain or other pain. No use of blood thinners.  Review of Systems  Positive: +R hip pain Negative: wound  Physical Exam  BP (!) 146/86   Pulse 84   Temp (!) 97.5 F (36.4 C) (Oral)   Resp 17   SpO2 91%  Gen:   Awake, no distress   Resp:  Normal effort  MSK:   Moves extremities without difficulty  Other:  +TTP of R lateral hip, +DP pulse  Medical Decision Making  Medically screening exam initiated at 3:31 PM.  Appropriate orders placed.  Kristen Orr was informed that the remainder of the evaluation will be completed by another provider, this initial triage assessment does not replace that evaluation, and the importance of remaining in the ED until their evaluation is complete.    Pete Pelt, [place] 01/06/23 1536

## 2023-01-06 NOTE — Progress Notes (Signed)
Consult received from EDP for displaced RIGHT femoral neck fracture. TRH admit pending.  Plan for R THA tomorrow. NPO after MN. Hold chemical DVT ppx. Full consult to follow.

## 2023-01-06 NOTE — ED Triage Notes (Signed)
Pt here from home with c/o right hip after tripping over a vacuum, no loc no thinners

## 2023-01-06 NOTE — ED Provider Notes (Addendum)
[place] EMERGENCY DEPARTMENT AT [facility] Provider Note   CSN: [id number] Arrival date & time: 01/06/23  1510     History  Chief Complaint  Patient presents with   Fall   Hip Pain    Kristen Orr is a 77 y.o. female.  Patient is a 77 year old female with a history of hypertension presenting today with ongoing right hip pain.  She reports around 1230 today she tripped over the vacuum cleaner landing on her right hip.  Since that time she has not been able to ambulate and has pain anytime she tries to walk.  She did take some ibuprofen at home and upon arrival here they gave her a Percocet.  She reports right now the pain is fairly controlled unless she tries to move.  She did not have any other injury when she fell.  She denies pain in her left side, arms.  She did not hit her head or lose consciousness.  She takes no anticoagulation.  The history is provided by the patient and medical records.  Fall  Hip Pain       Home Medications Prior to Admission medications   Medication Sig Start Date End Date Taking? Authorizing Provider  Ascorbic Acid (VITAMIN C PO) Take 1 tablet by mouth 2 (two) times daily.    [provider]  B Complex Vitamins (VITAMIN B COMPLEX PO) Take 1 tablet by mouth daily.    [provider]  buPROPion (WELLBUTRIN XL) 150 MG 24 hr tablet  07/01/18   [provider]  chlorthalidone (HYGROTON) 25 MG tablet  06/27/18   [provider]  citalopram (CELEXA) 20 MG tablet Take 20 mg by mouth daily.    [provider]  ezetimibe (ZETIA) 10 MG tablet  05/01/18   [provider]  fexofenadine (ALLEGRA) 180 MG tablet Take 180 mg by mouth daily as needed for allergies or rhinitis.    [provider]  ibuprofen (ADVIL,MOTRIN) 200 MG tablet Take 200 mg by mouth every 6 (six) hours as needed for moderate pain.    [provider]  irbesartan (AVAPRO) 150 MG tablet  06/27/18   [provider]  silver sulfADIAZINE (SILVADENE) 1 % cream Apply 1 application topically daily. 03/03/14   Zadie Rhine, MD  VAGIFEM 10 MCG TABS vaginal tablet Take 1 tablet by mouth 2 (two) times a week.  02/25/14   [provider]      Allergies    Tape, Egg-derived products, and Penicillins    Review of Systems   Review of Systems  Physical Exam Updated Vital Signs BP 129/77   Pulse 80   Temp 97.7 F (36.5 C) (Oral)   Resp 14   Ht 5\' 4"  (1.626 m)   Wt 64.4 kg   SpO2 94%   BMI 24.37 kg/m  Physical Exam Vitals and nursing note reviewed.  Constitutional:      General: She is not in acute distress.    Appearance: She is well-developed.  HENT:     Head: Normocephalic and atraumatic.  Eyes:     Pupils: Pupils are equal, round, and reactive to light.  Cardiovascular:     Rate and Rhythm: Normal rate and regular rhythm.     Pulses: Normal pulses.     Heart sounds: Normal heart sounds. No murmur heard.    No friction rub.  Pulmonary:     Effort: Pulmonary effort is normal.     Breath sounds: Normal breath sounds.  No wheezing or rales.  Abdominal:     General: Bowel sounds are normal. There is no distension.     Palpations: Abdomen is soft.     Tenderness: There is no abdominal tenderness. There is no guarding or rebound.  Musculoskeletal:        General: Tenderness present.     Right hip: Tenderness present. Decreased range of motion.     Comments: No edema.  Significant pain with right hip internal rotation but okay with external rotation.  Also painful with hip flexion  Skin:    General: Skin is warm and dry.     Findings: No rash.  Neurological:     Mental Status: She is alert and oriented to person, place, and time.     Cranial Nerves: No cranial nerve deficit.  Psychiatric:        Behavior: Behavior normal.     ED Results / Procedures / Treatments   Labs (all labs ordered are listed, but only abnormal results are displayed) Labs Reviewed  BASIC  METABOLIC PANEL - Abnormal; Notable for the following components:      Result Value   Sodium 131 (*)    Potassium 3.1 (*)    Chloride 95 (*)    All other components within normal limits  CBC WITH DIFFERENTIAL/PLATELET - Abnormal; Notable for the following components:   WBC 15.3 (*)    Neutro Abs 12.7 (*)    Abs Immature Granulocytes 0.11 (*)    All other components within normal limits  PROTIME-INR  TYPE AND SCREEN  ABO/RH    EKG EKG Interpretation Date/Time:  Saturday January 06 2023 16:59:38 EDT Ventricular Rate:  75 PR Interval:  171 QRS Duration:  115 QT Interval:  455 QTC Calculation: 509 R Axis:   -9  Text Interpretation: Sinus rhythm Nonspecific intraventricular conduction delay Low voltage, extremity and precordial leads No previous tracing Confirmed by Gwyneth Sprout ([id number]) on 01/06/2023 5:19:05 PM  Radiology DG Hip Unilat W or Wo Pelvis 2-3 Views Right  Result Date: 01/06/2023 CLINICAL DATA:  Right hip pain after fall, unable to bear weight. EXAM: DG HIP (WITH OR WITHOUT PELVIS) 2-3V RIGHT COMPARISON:  None Available. FINDINGS: Displaced fracture of the femoral neck. Slight medial displacement of the distal femur. The femoral head remains seated in the acetabulum. No hip dislocation. No additional pelvis of pelvic ring. Pubic rami are intact. No pubic symphyseal or sacroiliac diastasis. IMPRESSION: Displaced right femoral neck fracture. Electronically Signed   By: Narda Rutherford M.D.   On: 01/06/2023 18:06    Procedures Procedures    Medications Ordered in ED Medications  morphine (PF) 4 MG/ML injection 4 mg (4 mg Intravenous Given 01/06/23 1713)  oxyCODONE-acetaminophen (PERCOCET/ROXICET) 5-325 MG per tablet 1 tablet (1 tablet Oral Given 01/06/23 1547)  ondansetron (ZOFRAN) injection 4 mg (4 mg Intravenous Given 01/06/23 1712)    ED Course/ Medical Decision Making/ A&P                                 Medical Decision Making Amount and/or Complexity  of Data Reviewed External Data Reviewed: notes. Labs: ordered. Decision-making details documented in ED Course. Radiology: ordered and independent interpretation performed. Decision-making details documented in ED Course. ECG/medicine tests: ordered and independent interpretation performed. Decision-making details documented in ED Course.  Risk Prescription drug management. Decision regarding hospitalization.   Pt  presenting today with a complaint that caries a high  risk for morbidity and mortality. Here today with pain in her hip.  Concern for hip fracture, pelvic fracture, sprain.  Neurovascularly intact on exam.  Patient does not take anticoagulation has had pain medication and appears pain controlled at this time.  I have independently visualized and interpreted pt's images today.  Hip images show findings concerning for  femoral neck fracture.  Patient had hip fracture protocol ordered.  I independently interpreted patient's EKG and labs.  EKG without acute findings, CBC with leukocytosis of 15 most likely consistent with acute phase reaction from fracture, BMP with mild hypokalemia of 3.1 but normal creatinine.  Will consult orthopedics and patient will need admission to hospitalist service.  Findings discussed with the patient and her husband.  They are comfortable with this plan. Spoke with Dr. Linna Caprice.  Plan will be n.p.o. after midnight for repair tomorrow        Final Clinical Impression(s) / ED Diagnoses Final diagnoses:  Fall, initial encounter  Closed displaced fracture of right femoral neck [facility])    Rx / DC Orders ED Discharge Orders     None         Gwyneth Sprout, MD 01/06/23 1845    Gwyneth Sprout, MD 01/06/23 [phone]

## 2023-01-06 NOTE — Assessment & Plan Note (Addendum)
-  likely due to home chlorthalidone -give 500cc NS bolus

## 2023-01-06 NOTE — Assessment & Plan Note (Addendum)
-   Displaced right femoral neck fracture following mechanical fall -Orthopedic surgery to take for right total hip arthroplasty in the morning.  N.p.o. past midnight. -As needed opioids for pain

## 2023-01-06 NOTE — H&P (Signed)
History and Physical    Patient: Kristen Orr UJW:[id number] DOB: 03/13/1946 DOA: 01/06/2023 DOS: the patient was seen and examined on 01/06/2023 PCP: Carylon Perches, MD  Patient coming from: Home  Chief Complaint:  Chief Complaint  Patient presents with   Fall   Hip Pain   HPI: Kristen Orr is a 77 y.o. female with medical history significant of HTN, HLD, depression who presents with right hip pain after tripping over vacuum at home.  Had a vacuum leaned up against kitchen counter. Pt didn't see it and tripped over it landing on right hip. Did not hit head. No prior chest pain, palpitation, dizziness or lightheadedness. Recently just had right knee replacement in March. No on any anticoagulation.    On arrival to the ED, she was afebrile, blood pressure 146/86 on room air.  CBC with leukocytosis of 15.3 K.  BMP with mild hyponatremia of 131, potassium of 3.1, glucose of 90.  Hip x-ray demonstrated displaced right femoral neck fracture.  Orthopedic Dr. Linna Caprice was consulted and recommended right total hip arthroplasty tomorrow.  Review of Systems: As mentioned in the history of present illness. All other systems reviewed and are negative. Past Medical History:  Diagnosis Date   Allergy    Hyperlipidemia    Hypertension    Varicose veins    Past Surgical History:  Procedure Laterality Date   PITUITARY SURGERY     TONSILLECTOMY     VARICOSE VEIN SURGERY     Social History:  reports that she quit smoking about 46 years ago. Her smoking use included cigarettes. She has never been exposed to tobacco smoke. She has never used smokeless tobacco. She reports current alcohol use. She reports that she does not use drugs.  Allergies  Allergen Reactions   Tape Hives   Egg-Derived Products    Penicillins Rash, Hives and Other (See Comments)    Family History  Problem Relation Age of Onset   Allergic rhinitis Mother    Cancer Mother    Hyperlipidemia Mother     Hypertension Mother    Cancer Father    Deep vein thrombosis Sister     Prior to Admission medications   Medication Sig Start Date End Date Taking? Authorizing Provider  Ascorbic Acid (VITAMIN C PO) Take 1 tablet by mouth 2 (two) times daily.   Yes [provider]  B Complex Vitamins (VITAMIN B COMPLEX PO) Take 1 tablet by mouth daily.   Yes [provider]  chlorthalidone (HYGROTON) 25 MG tablet  06/27/18  Yes [provider]  citalopram (CELEXA) 20 MG tablet Take 20 mg by mouth daily.   Yes [provider]  ezetimibe (ZETIA) 10 MG tablet  05/01/18  Yes [provider]  fexofenadine (ALLEGRA) 180 MG tablet Take 180 mg by mouth daily as needed for allergies or rhinitis.   Yes [provider]  ibuprofen (ADVIL,MOTRIN) 200 MG tablet Take 400 mg by mouth once as needed for moderate pain (pain score 4-6).   Yes [provider]  irbesartan (AVAPRO) 150 MG tablet  06/27/18  Yes [provider]  VAGIFEM 10 MCG TABS vaginal tablet Take 1 tablet by mouth 2 (two) times a week.  02/25/14  Yes [provider]    Physical Exam: Vitals:   01/06/23 1628 01/06/23 1830 01/06/23 2016 01/06/23 2115  BP: (!) 162/87 129/77  122/71  Pulse: 78 80  86  Resp: 17 14  17   Temp: 97.7 F (36.5 C)  97.9 F (  36.6 C)   TempSrc: Oral  Oral   SpO2: 93% 94%  91%  Weight: 64.4 kg     Height: 5\' 4"  (1.626 m)      Constitutional: NAD, calm, comfortable, well appearing younger than stated age female upright in bed Eyes: lids and conjunctivae normal ENMT: Mucous membranes are moist.  Neck: normal, supple Respiratory: clear to auscultation bilaterally, no wheezing, no crackles. Normal respiratory effort. No accessory muscle use.  Cardiovascular: Regular rate and rhythm, no murmurs / rubs / gallops. No extremity edema.  Abdomen: no tenderness, soft Musculoskeletal: no clubbing / cyanosis. No joint deformity upper and lower extremities. No  obvious shortening or rotation of the right LE. Normal muscle tone.  Skin: no rashes, lesions, ulcers. No induration Neurologic: CN 2-12 grossly intact. Sensation intact. Psychiatric: Normal judgment and insight. Alert and oriented x 3. Normal mood.   Data Reviewed:  See HPI  Assessment and Plan: * Hip fracture (HCC) - Displaced right femoral neck fracture following mechanical fall -Orthopedic surgery to take for right total hip arthroplasty in the morning.  N.p.o. past midnight. -As needed opioids for pain  Hyponatremia -likely due to home chlorthalidone -give 500cc NS bolus   Hypokalemia - Administered oral potassium  HTN (hypertension) - Continue irbesartan, chlorthalidone      Advance Care Planning:   Code Status: Full Code   Consults: Orthopedic surgery Dr. Linna Caprice  Family Communication: Husband at bedside  Severity of Illness: The appropriate patient status for this patient is INPATIENT. Inpatient status is judged to be reasonable and necessary in order to provide the required intensity of service to ensure the patient's safety. The patient's presenting symptoms, physical exam findings, and initial radiographic and laboratory data in the context of their chronic comorbidities is felt to place them at high risk for further clinical deterioration. Furthermore, it is not anticipated that the patient will be medically stable for discharge from the hospital within 2 midnights of admission.   * I certify that at the point of admission it is my clinical judgment that the patient will require inpatient hospital care spanning beyond 2 midnights from the point of admission due to high intensity of service, high risk for further deterioration and high frequency of surveillance required.*  Author: Anselm Jungling, DO 01/06/2023 10:42 PM  For on call review [email or web address].

## 2023-01-06 NOTE — Assessment & Plan Note (Signed)
-   Continue irbesartan, chlorthalidone

## 2023-01-06 NOTE — Assessment & Plan Note (Signed)
-   Administered oral potassium

## 2023-01-06 NOTE — H&P (View-Only) (Signed)
Consult received from EDP for displaced RIGHT femoral neck fracture. [facility] admit pending.  Plan for R THA tomorrow. NPO after MN. Hold chemical DVT ppx. Full consult to follow.

## 2023-01-06 NOTE — Consult Note (Signed)
ORTHOPAEDIC CONSULTATION  REQUESTING PHYSICIAN: Anselm Jungling, DO  PCP:  Carylon Perches, MD  Chief Complaint: Right hip injury.   HPI: Kristen Orr is a 77 y.o. female history of hypertension presenting today with ongoing right hip pain. She reports around 1230 today she tripped over the vacuum cleaner landing on her right hip. Since that time she has not been able to ambulate and has pain anytime she tries to walk. She reports right now the pain is fairly controlled unless she tries to move. She did not have any other injury when she fell. She denies pain in her left side, arms. She did not hit her head or lose consciousness. She takes no anticoagulation. X-rays showed right femoral neck fracture. Orthopedics consulted.   She lives at home with her husband. She does not usually use assistive devices at home. She had recent right TKA with Dr. Lequita Halt in March. She has a walker at home.    Past Medical History:  Diagnosis Date   Allergy    Hyperlipidemia    Hypertension    Varicose veins    Past Surgical History:  Procedure Laterality Date   PITUITARY SURGERY     TONSILLECTOMY     VARICOSE VEIN SURGERY     Social History   Socioeconomic History   Marital status: Married    Spouse name: Not on file   Number of children: Not on file   Years of education: Not on file   Highest education level: Not on file  Occupational History   Not on file  Tobacco Use   Smoking status: Former    Current packs/day: 0.00    Types: Cigarettes    Quit date: 04/29/1976    Years since quitting: 46.7    Passive exposure: Never   Smokeless tobacco: Never  Vaping Use   Vaping status: Never Used  Substance and Sexual Activity   Alcohol use: Yes   Drug use: No   Sexual activity: Not on file  Other Topics Concern   Not on file  Social History Narrative   Not on file   Social Determinants of Health   Financial Resource Strain: Not on file  Food Insecurity: No Food Insecurity  (01/06/2023)   Hunger Vital Sign    Worried About Running Out of Food in the Last Year: Never true    Ran Out of Food in the Last Year: Never true  Transportation Needs: No Transportation Needs (01/06/2023)   PRAPARE - Administrator, Civil Service (Medical): No    Lack of Transportation (Non-Medical): No  Physical Activity: Not on file  Stress: Not on file  Social Connections: Unknown (08/01/2021)   [facility]   Social Network    Social Network: Not on file   Family History  Problem Relation Age of Onset   Allergic rhinitis Mother    Cancer Mother    Hyperlipidemia Mother    Hypertension Mother    Cancer Father    Deep vein thrombosis Sister    Allergies  Allergen Reactions   Tape Hives   Egg-Derived Products    Penicillins Rash, Hives and Other (See Comments)   Prior to Admission medications   Medication Sig Start Date End Date Taking? Authorizing Provider  Ascorbic Acid (VITAMIN C PO) Take 1 tablet by mouth 2 (two) times daily.   Yes [provider]  B Complex Vitamins (VITAMIN B COMPLEX PO) Take 1 tablet by mouth daily.  Yes [provider]  chlorthalidone (HYGROTON) 25 MG tablet  06/27/18  Yes [provider]  citalopram (CELEXA) 20 MG tablet Take 20 mg by mouth daily.   Yes [provider]  ezetimibe (ZETIA) 10 MG tablet  05/01/18  Yes [provider]  fexofenadine (ALLEGRA) 180 MG tablet Take 180 mg by mouth daily as needed for allergies or rhinitis.   Yes [provider]  ibuprofen (ADVIL,MOTRIN) 200 MG tablet Take 400 mg by mouth once as needed for moderate pain (pain score 4-6).   Yes [provider]  irbesartan (AVAPRO) 150 MG tablet  06/27/18  Yes [provider]  VAGIFEM 10 MCG TABS vaginal tablet Take 1 tablet by mouth 2 (two) times a week.  02/25/14  Yes [provider]   DG Knee Right Port  Result Date: 01/06/2023 CLINICAL DATA:  Known  right femoral neck fracture knee pain, initial encounter EXAM: PORTABLE RIGHT KNEE - 2 VIEW COMPARISON:  None Available. FINDINGS: Right knee replacement is noted. No acute bony abnormality is noted. IMPRESSION: No acute abnormality noted. Electronically Signed   By: Alcide Clever M.D.   On: 01/06/2023 21:53   DG Hip Unilat W or Wo Pelvis 2-3 Views Right  Result Date: 01/06/2023 CLINICAL DATA:  Right hip pain after fall, unable to bear weight. EXAM: DG HIP (WITH OR WITHOUT PELVIS) 2-3V RIGHT COMPARISON:  None Available. FINDINGS: Displaced fracture of the femoral neck. Slight medial displacement of the distal femur. The femoral head remains seated in the acetabulum. No hip dislocation. No additional pelvis of pelvic ring. Pubic rami are intact. No pubic symphyseal or sacroiliac diastasis. IMPRESSION: Displaced right femoral neck fracture. Electronically Signed   By: Narda Rutherford M.D.   On: 01/06/2023 18:06    Positive ROS: All other systems have been reviewed and were otherwise negative with the exception of those mentioned in the HPI and as above.  Physical Exam: General: Alert, no acute distress Cardiovascular: No pedal edema Respiratory: No cyanosis, no use of accessory musculature GI: No organomegaly, abdomen is soft and non-tender Skin: No lesions in the area of chief complaint Neurologic: Sensation intact distally Psychiatric: Patient is competent for consent with normal mood and affect Lymphatic: No axillary or cervical lymphadenopathy  MUSCULOSKELETAL: Examination of the right hip reveals no skin wounds or lesions. Shortening and external rotation. Pain with palpation over trochanteric region. ROM not performed due to known fracture. Right knee has healed anterior incision from recent right TKA.   Sensory and motor function intact in LE bilaterally including plantar flexion, dorsiflexion and EHL. Distal pedal pulses 2+ bilaterally. No significant pedal edema. Calves soft and  non-tender.   Assessment: Displaced right femoral neck fracture.   Plan: I discussed the findings with the patient and her husband. She has an unstable right femoral neck fracture that will require surgical treatment for pain control and allow immediate mobilization out of bed. TRH consulted for admission and perioperative medial optimization. Plan for right total hip arthroplasty tomorrow morning. Discussed R/B/A. See statement of risk. Patient to remain NPO after midnight tonight. Hold chemical DVT ppx. All questions solicited and answered.   The risks, benefits, and alternatives were discussed with the patient. There are risks associated with the surgery including, but not limited to, problems with anesthesia (death), infection, differences in leg length/angulation/rotation, fracture of bones, loosening or failure of implants, malunion, nonunion, hematoma (blood accumulation) which may require surgical drainage, blood clots, pulmonary embolism, nerve injury (foot drop), and blood  vessel injury. The patient understands these risks and elects to proceed.     Clois Dupes, PA-C    01/06/2023 10:07 PM

## 2023-01-06 NOTE — ED Notes (Signed)
Purewix placed for pt to use while in the ED.

## 2023-01-07 ENCOUNTER — Encounter (HOSPITAL_COMMUNITY): Payer: Self-pay | Admitting: Family Medicine

## 2023-01-07 ENCOUNTER — Inpatient Hospital Stay (HOSPITAL_COMMUNITY): Payer: Medicare HMO | Admitting: Certified Registered"

## 2023-01-07 ENCOUNTER — Ambulatory Visit: Payer: Self-pay | Admitting: Student

## 2023-01-07 ENCOUNTER — Inpatient Hospital Stay (HOSPITAL_COMMUNITY): Payer: Medicare HMO

## 2023-01-07 ENCOUNTER — Other Ambulatory Visit: Payer: Self-pay

## 2023-01-07 ENCOUNTER — Encounter (HOSPITAL_COMMUNITY)
Admission: EM | Disposition: A | Discharge status: Home / Self Care | Payer: Self-pay | Source: Home / Self Care | Attending: Student

## 2023-01-07 DIAGNOSIS — S72001A Fracture of unspecified part of neck of right femur, initial encounter for closed fracture: Secondary | ICD-10-CM | POA: Diagnosis not present

## 2023-01-07 DIAGNOSIS — E871 Hypo-osmolality and hyponatremia: Secondary | ICD-10-CM | POA: Diagnosis not present

## 2023-01-07 DIAGNOSIS — E876 Hypokalemia: Secondary | ICD-10-CM | POA: Diagnosis not present

## 2023-01-07 DIAGNOSIS — I10 Essential (primary) hypertension: Secondary | ICD-10-CM | POA: Diagnosis not present

## 2023-01-07 HISTORY — PX: TOTAL HIP ARTHROPLASTY: SHX124

## 2023-01-07 LAB — MAGNESIUM: Magnesium: 1.7 mg/dL (ref 1.7–2.4)

## 2023-01-07 LAB — CBC
HCT: 33.5 % — ABNORMAL LOW (ref 36.0–46.0)
Hemoglobin: 11 g/dL — ABNORMAL LOW (ref 12.0–15.0)
MCH: 29.6 pg (ref 26.0–34.0)
MCHC: 32.8 g/dL (ref 30.0–36.0)
MCV: 90.1 fL (ref 80.0–100.0)
Platelets: 265 10*3/uL (ref 150–400)
RBC: 3.72 MIL/uL — ABNORMAL LOW (ref 3.87–5.11)
RDW: 13.3 % (ref 11.5–15.5)
WBC: 15 10*3/uL — ABNORMAL HIGH (ref 4.0–10.5)
nRBC: 0 % (ref 0.0–0.2)

## 2023-01-07 LAB — RENAL FUNCTION PANEL
Albumin: 3.3 g/dL — ABNORMAL LOW (ref 3.5–5.0)
Anion gap: 11 (ref 5–15)
BUN: 17 mg/dL (ref 8–23)
CO2: 24 mmol/L (ref 22–32)
Calcium: 8.3 mg/dL — ABNORMAL LOW (ref 8.9–10.3)
Chloride: 97 mmol/L — ABNORMAL LOW (ref 98–111)
Creatinine, Ser: 0.99 mg/dL (ref 0.44–1.00)
GFR, Estimated: 59 mL/min — ABNORMAL LOW (ref >60)
Glucose, Bld: 173 mg/dL — ABNORMAL HIGH (ref 70–99)
Phosphorus: 3.4 mg/dL (ref 2.5–4.6)
Potassium: 3.6 mmol/L (ref 3.5–5.1)
Sodium: 132 mmol/L — ABNORMAL LOW (ref 135–145)

## 2023-01-07 LAB — SURGICAL PCR SCREEN
MRSA, PCR: NEGATIVE
Staphylococcus aureus: NEGATIVE

## 2023-01-07 LAB — ABO/RH: ABO/RH(D): A POS

## 2023-01-07 LAB — VITAMIN D 25 HYDROXY (VIT D DEFICIENCY, FRACTURES): Vit D, 25-Hydroxy: 30.58 ng/mL (ref 30–100)

## 2023-01-07 SURGERY — ARTHROPLASTY, HIP, TOTAL, ANTERIOR APPROACH
Anesthesia: General | Site: Hip | Laterality: Right

## 2023-01-07 MED ORDER — MENTHOL 3 MG MT LOZG: 1.0000 | LOZENGE | OROMUCOSAL | Status: DC | PRN

## 2023-01-07 MED ORDER — ASPIRIN 81 MG PO CHEW
81.0000 mg | CHEWABLE_TABLET | Freq: Two times a day (BID) | ORAL | Status: DC
  Administered 2023-01-07 – 2023-01-08 (×2): 81 mg via ORAL
  Filled 2023-01-07 (×2): qty 1

## 2023-01-07 MED ORDER — PROPOFOL 10 MG/ML IV BOLUS
INTRAVENOUS | Status: DC | PRN
  Administered 2023-01-07: 130 mg via INTRAVENOUS

## 2023-01-07 MED ORDER — METOCLOPRAMIDE HCL 5 MG/ML IJ SOLN: 5.0000 mg | Freq: Three times a day (TID) | INTRAMUSCULAR | Status: DC | PRN

## 2023-01-07 MED ORDER — CEFAZOLIN SODIUM-DEXTROSE 2-4 GM/100ML-% IV SOLN
2.0000 g | INTRAVENOUS | Status: AC
  Administered 2023-01-07: 2 g via INTRAVENOUS

## 2023-01-07 MED ORDER — SODIUM CHLORIDE 0.9 % IV SOLN: INTRAVENOUS | Status: DC | PRN

## 2023-01-07 MED ORDER — FENTANYL CITRATE (PF) 100 MCG/2ML IJ SOLN: 25.0000 ug | INTRAMUSCULAR | Status: DC | PRN

## 2023-01-07 MED ORDER — CEFAZOLIN SODIUM-DEXTROSE 2-4 GM/100ML-% IV SOLN
INTRAVENOUS | Status: AC
  Filled 2023-01-07: qty 100

## 2023-01-07 MED ORDER — METOCLOPRAMIDE HCL 5 MG PO TABS: 5.0000 mg | ORAL_TABLET | Freq: Three times a day (TID) | ORAL | Status: DC | PRN

## 2023-01-07 MED ORDER — POVIDONE-IODINE 10 % EX SWAB: 2.0000 | Freq: Once | CUTANEOUS | Status: DC

## 2023-01-07 MED ORDER — ACETAMINOPHEN 500 MG PO TABS
ORAL_TABLET | ORAL | Status: AC
  Administered 2023-01-07: 1000 mg via ORAL
  Filled 2023-01-07: qty 2

## 2023-01-07 MED ORDER — KETOROLAC TROMETHAMINE 30 MG/ML IJ SOLN
INTRAMUSCULAR | Status: DC | PRN
  Administered 2023-01-07: 30 mg

## 2023-01-07 MED ORDER — CHLORHEXIDINE GLUCONATE 0.12 % MT SOLN
OROMUCOSAL | Status: AC
  Administered 2023-01-07: 15 mL via OROMUCOSAL
  Filled 2023-01-07: qty 15

## 2023-01-07 MED ORDER — DEXAMETHASONE SODIUM PHOSPHATE 10 MG/ML IJ SOLN
INTRAMUSCULAR | Status: AC
  Filled 2023-01-07: qty 1

## 2023-01-07 MED ORDER — PROPOFOL 1000 MG/100ML IV EMUL
INTRAVENOUS | Status: AC
  Filled 2023-01-07: qty 100

## 2023-01-07 MED ORDER — ACETAMINOPHEN 10 MG/ML IV SOLN
INTRAVENOUS | Status: AC
  Filled 2023-01-07: qty 100

## 2023-01-07 MED ORDER — HYDROCODONE-ACETAMINOPHEN 7.5-325 MG PO TABS: 1.0000 | ORAL_TABLET | ORAL | Status: DC | PRN

## 2023-01-07 MED ORDER — ALUM & MAG HYDROXIDE-SIMETH 200-200-20 MG/5ML PO SUSP: 30.0000 mL | ORAL | Status: DC | PRN

## 2023-01-07 MED ORDER — DEXAMETHASONE SODIUM PHOSPHATE 10 MG/ML IJ SOLN
INTRAMUSCULAR | Status: DC | PRN
  Administered 2023-01-07: 5 mg via INTRAVENOUS

## 2023-01-07 MED ORDER — PROPOFOL 10 MG/ML IV BOLUS
INTRAVENOUS | Status: AC
  Filled 2023-01-07: qty 20

## 2023-01-07 MED ORDER — ORAL CARE MOUTH RINSE: 15.0000 mL | Freq: Once | OROMUCOSAL | Status: AC

## 2023-01-07 MED ORDER — DIPHENHYDRAMINE HCL 12.5 MG/5ML PO ELIX: 12.5000 mg | ORAL_SOLUTION | ORAL | Status: DC | PRN

## 2023-01-07 MED ORDER — CHLORHEXIDINE GLUCONATE 0.12 % MT SOLN: 15.0000 mL | Freq: Once | OROMUCOSAL | Status: AC

## 2023-01-07 MED ORDER — SODIUM CHLORIDE 0.9% FLUSH
10.0000 mL | Freq: Once | INTRAVENOUS | Status: AC
  Administered 2023-01-07: 10 mL via INTRAVENOUS

## 2023-01-07 MED ORDER — PHENYLEPHRINE HCL-NACL 20-0.9 MG/250ML-% IV SOLN
INTRAVENOUS | Status: AC
  Filled 2023-01-07: qty 500

## 2023-01-07 MED ORDER — ONDANSETRON HCL 4 MG/2ML IJ SOLN
INTRAMUSCULAR | Status: AC
  Filled 2023-01-07: qty 2

## 2023-01-07 MED ORDER — PROPOFOL 500 MG/50ML IV EMUL
INTRAVENOUS | Status: DC | PRN
  Administered 2023-01-07: 100 ug/kg/min via INTRAVENOUS

## 2023-01-07 MED ORDER — CHLORHEXIDINE GLUCONATE CLOTH 2 % EX PADS: 6.0000 | MEDICATED_PAD | Freq: Once | CUTANEOUS | Status: DC

## 2023-01-07 MED ORDER — CEFAZOLIN SODIUM-DEXTROSE 2-4 GM/100ML-% IV SOLN
2.0000 g | Freq: Four times a day (QID) | INTRAVENOUS | Status: AC
  Administered 2023-01-07 – 2023-01-08 (×2): 2 g via INTRAVENOUS
  Filled 2023-01-07 (×2): qty 100

## 2023-01-07 MED ORDER — HYDROCODONE-ACETAMINOPHEN 5-325 MG PO TABS
1.0000 | ORAL_TABLET | ORAL | Status: DC | PRN
  Administered 2023-01-07: 1 via ORAL
  Administered 2023-01-08 (×3): 2 via ORAL
  Filled 2023-01-07 (×4): qty 2

## 2023-01-07 MED ORDER — EPHEDRINE 5 MG/ML INJ
INTRAVENOUS | Status: AC
  Filled 2023-01-07: qty 5

## 2023-01-07 MED ORDER — ROCURONIUM BROMIDE 10 MG/ML (PF) SYRINGE
PREFILLED_SYRINGE | INTRAVENOUS | Status: DC | PRN
  Administered 2023-01-07: 10 mg via INTRAVENOUS
  Administered 2023-01-07: 20 mg via INTRAVENOUS
  Administered 2023-01-07: 40 mg via INTRAVENOUS
  Administered 2023-01-07: 10 mg via INTRAVENOUS

## 2023-01-07 MED ORDER — CHLORTHALIDONE 25 MG PO TABS
25.0000 mg | ORAL_TABLET | Freq: Every day | ORAL | Status: DC
  Filled 2023-01-07: qty 1

## 2023-01-07 MED ORDER — EZETIMIBE 10 MG PO TABS
10.0000 mg | ORAL_TABLET | Freq: Every day | ORAL | Status: DC
  Administered 2023-01-07 – 2023-01-08 (×2): 10 mg via ORAL
  Filled 2023-01-07 (×2): qty 1

## 2023-01-07 MED ORDER — PRONTOSAN WOUND IRRIGATION OPTIME
TOPICAL | Status: DC | PRN
  Administered 2023-01-07: 350 mL

## 2023-01-07 MED ORDER — LIDOCAINE 2% (20 MG/ML) 5 ML SYRINGE
INTRAMUSCULAR | Status: DC | PRN
  Administered 2023-01-07: 60 mg via INTRAVENOUS

## 2023-01-07 MED ORDER — METHOCARBAMOL 1000 MG/10ML IJ SOLN: 500.0000 mg | Freq: Four times a day (QID) | INTRAMUSCULAR | Status: DC | PRN

## 2023-01-07 MED ORDER — FENTANYL CITRATE (PF) 250 MCG/5ML IJ SOLN
INTRAMUSCULAR | Status: DC | PRN
  Administered 2023-01-07 (×5): 50 ug via INTRAVENOUS

## 2023-01-07 MED ORDER — ALBUMIN HUMAN 5 % IV SOLN: INTRAVENOUS | Status: DC | PRN

## 2023-01-07 MED ORDER — SUGAMMADEX SODIUM 200 MG/2ML IV SOLN
INTRAVENOUS | Status: DC | PRN
  Administered 2023-01-07: 150 mg via INTRAVENOUS

## 2023-01-07 MED ORDER — KETOROLAC TROMETHAMINE 30 MG/ML IJ SOLN
INTRAMUSCULAR | Status: AC
  Filled 2023-01-07: qty 1

## 2023-01-07 MED ORDER — FENTANYL CITRATE (PF) 100 MCG/2ML IJ SOLN
INTRAMUSCULAR | Status: AC
  Administered 2023-01-07: 50 ug via INTRAVENOUS
  Filled 2023-01-07: qty 2

## 2023-01-07 MED ORDER — FENTANYL CITRATE (PF) 250 MCG/5ML IJ SOLN
INTRAMUSCULAR | Status: AC
  Filled 2023-01-07: qty 5

## 2023-01-07 MED ORDER — PHENYLEPHRINE 80 MCG/ML (10ML) SYRINGE FOR IV PUSH (FOR BLOOD PRESSURE SUPPORT)
PREFILLED_SYRINGE | INTRAVENOUS | Status: DC | PRN
  Administered 2023-01-07 (×2): 160 ug via INTRAVENOUS

## 2023-01-07 MED ORDER — PHENOL 1.4 % MT LIQD: 1.0000 | OROMUCOSAL | Status: DC | PRN

## 2023-01-07 MED ORDER — ONDANSETRON HCL 4 MG/2ML IJ SOLN: 4.0000 mg | Freq: Four times a day (QID) | INTRAMUSCULAR | Status: DC | PRN

## 2023-01-07 MED ORDER — POVIDONE-IODINE 10 % EX SWAB
2.0000 | Freq: Once | CUTANEOUS | Status: AC
  Administered 2023-01-07: 2 via TOPICAL

## 2023-01-07 MED ORDER — SODIUM CHLORIDE 0.9 % IR SOLN
Status: DC | PRN
  Administered 2023-01-07: 1500 mL

## 2023-01-07 MED ORDER — ONDANSETRON HCL 4 MG PO TABS: 4.0000 mg | ORAL_TABLET | Freq: Four times a day (QID) | ORAL | Status: DC | PRN

## 2023-01-07 MED ORDER — BUPIVACAINE-EPINEPHRINE (PF) 0.5% -1:200000 IJ SOLN
INTRAMUSCULAR | Status: AC
  Filled 2023-01-07: qty 30

## 2023-01-07 MED ORDER — PHENYLEPHRINE 80 MCG/ML (10ML) SYRINGE FOR IV PUSH (FOR BLOOD PRESSURE SUPPORT)
PREFILLED_SYRINGE | INTRAVENOUS | Status: AC
  Filled 2023-01-07: qty 10

## 2023-01-07 MED ORDER — PANTOPRAZOLE SODIUM 40 MG PO TBEC
40.0000 mg | DELAYED_RELEASE_TABLET | Freq: Every day | ORAL | Status: DC
  Administered 2023-01-07 – 2023-01-08 (×2): 40 mg via ORAL
  Filled 2023-01-07 (×2): qty 1

## 2023-01-07 MED ORDER — TRANEXAMIC ACID-NACL 1000-0.7 MG/100ML-% IV SOLN
1000.0000 mg | INTRAVENOUS | Status: AC
  Administered 2023-01-07: 1000 mg via INTRAVENOUS

## 2023-01-07 MED ORDER — TRANEXAMIC ACID-NACL 1000-0.7 MG/100ML-% IV SOLN
INTRAVENOUS | Status: AC
  Filled 2023-01-07: qty 100

## 2023-01-07 MED ORDER — ONDANSETRON HCL 4 MG/2ML IJ SOLN
INTRAMUSCULAR | Status: DC | PRN
  Administered 2023-01-07: 4 mg via INTRAVENOUS

## 2023-01-07 MED ORDER — CHLORHEXIDINE GLUCONATE CLOTH 2 % EX PADS
6.0000 | MEDICATED_PAD | Freq: Once | CUTANEOUS | Status: AC
  Administered 2023-01-07: 6 via TOPICAL

## 2023-01-07 MED ORDER — CHLORHEXIDINE GLUCONATE 4 % EX SOLN
60.0000 mL | Freq: Once | CUTANEOUS | Status: AC
  Administered 2023-01-07: 4 via TOPICAL
  Filled 2023-01-07: qty 60

## 2023-01-07 MED ORDER — ROCURONIUM BROMIDE 10 MG/ML (PF) SYRINGE
PREFILLED_SYRINGE | INTRAVENOUS | Status: AC
  Filled 2023-01-07: qty 10

## 2023-01-07 MED ORDER — SODIUM CHLORIDE (PF) 0.9 % IJ SOLN
INTRAMUSCULAR | Status: DC | PRN
  Administered 2023-01-07: 30 mL

## 2023-01-07 MED ORDER — LABETALOL HCL 5 MG/ML IV SOLN: 10.0000 mg | INTRAVENOUS | Status: DC | PRN

## 2023-01-07 MED ORDER — SODIUM CHLORIDE 0.9 % IV SOLN
INTRAVENOUS | Status: AC | PRN
  Administered 2023-01-07: 250 mL

## 2023-01-07 MED ORDER — BUPIVACAINE-EPINEPHRINE (PF) 0.5% -1:200000 IJ SOLN
INTRAMUSCULAR | Status: DC | PRN
  Administered 2023-01-07: 30 mL

## 2023-01-07 MED ORDER — MORPHINE SULFATE (PF) 2 MG/ML IV SOLN: 0.5000 mg | INTRAVENOUS | Status: DC | PRN

## 2023-01-07 MED ORDER — POLYETHYLENE GLYCOL 3350 17 G PO PACK: 17.0000 g | PACK | Freq: Every day | ORAL | Status: DC | PRN

## 2023-01-07 MED ORDER — AMISULPRIDE (ANTIEMETIC) 5 MG/2ML IV SOLN: 10.0000 mg | Freq: Once | INTRAVENOUS | Status: DC | PRN

## 2023-01-07 MED ORDER — DOCUSATE SODIUM 100 MG PO CAPS
100.0000 mg | ORAL_CAPSULE | Freq: Two times a day (BID) | ORAL | Status: DC
  Administered 2023-01-07 – 2023-01-08 (×3): 100 mg via ORAL
  Filled 2023-01-07 (×3): qty 1

## 2023-01-07 MED ORDER — METHOCARBAMOL 500 MG PO TABS
500.0000 mg | ORAL_TABLET | Freq: Four times a day (QID) | ORAL | Status: DC | PRN
  Administered 2023-01-07 – 2023-01-08 (×2): 500 mg via ORAL
  Filled 2023-01-07 (×2): qty 1

## 2023-01-07 MED ORDER — LIDOCAINE 2% (20 MG/ML) 5 ML SYRINGE
INTRAMUSCULAR | Status: AC
  Filled 2023-01-07: qty 5

## 2023-01-07 MED ORDER — SODIUM CHLORIDE 0.9 % IV SOLN: INTRAVENOUS | Status: DC

## 2023-01-07 MED ORDER — BISACODYL 10 MG RE SUPP: 10.0000 mg | Freq: Every day | RECTAL | Status: DC | PRN

## 2023-01-07 MED ORDER — ACETAMINOPHEN 325 MG PO TABS: 325.0000 mg | ORAL_TABLET | Freq: Four times a day (QID) | ORAL | Status: DC | PRN

## 2023-01-07 MED ORDER — CITALOPRAM HYDROBROMIDE 20 MG PO TABS
20.0000 mg | ORAL_TABLET | Freq: Every day | ORAL | Status: DC
  Administered 2023-01-07 – 2023-01-08 (×2): 20 mg via ORAL
  Filled 2023-01-07 (×2): qty 1

## 2023-01-07 MED ORDER — PHENYLEPHRINE HCL-NACL 20-0.9 MG/250ML-% IV SOLN
INTRAVENOUS | Status: DC | PRN
  Administered 2023-01-07: 50 ug/min via INTRAVENOUS

## 2023-01-07 MED ORDER — IRBESARTAN 150 MG PO TABS: 150.0000 mg | ORAL_TABLET | Freq: Every day | ORAL | Status: DC

## 2023-01-07 MED ORDER — ACETAMINOPHEN 500 MG PO TABS: 1000.0000 mg | ORAL_TABLET | Freq: Once | ORAL | Status: AC

## 2023-01-07 SURGICAL SUPPLY — 53 items
ADH SKN CLS APL DERMABOND .7 (GAUZE/BANDAGES/DRESSINGS) ×2
ALCOHOL 70% 16 OZ (MISCELLANEOUS) ×1 IMPLANT
APL PRP STRL LF DISP 70% ISPRP (MISCELLANEOUS) ×1
BAG COUNTER SPONGE SURGICOUNT (BAG) ×1 IMPLANT
BAG SPNG CNTER NS LX DISP (BAG) ×1
CHLORAPREP W/TINT 26 (MISCELLANEOUS) ×1 IMPLANT
COVER SURGICAL LIGHT HANDLE (MISCELLANEOUS) ×1 IMPLANT
DERMABOND ADVANCED .7 DNX12 (GAUZE/BANDAGES/DRESSINGS) ×2 IMPLANT
DRAPE 3/4 80X56 (DRAPES) ×3 IMPLANT
DRAPE C-ARM 42X72 X-RAY (DRAPES) ×1 IMPLANT
DRAPE STERI IOBAN 125X83 (DRAPES) ×1 IMPLANT
DRAPE U-SHAPE 47X51 STRL (DRAPES) ×3 IMPLANT
DRSG AQUACEL AG ADV 3.5X10 (GAUZE/BANDAGES/DRESSINGS) ×1 IMPLANT
ELECT BLADE 4.0 EZ CLEAN MEGAD (MISCELLANEOUS) ×1
ELECT PENCIL ROCKER SW 15FT (MISCELLANEOUS) ×1 IMPLANT
ELECT REM PT RETURN 9FT ADLT (ELECTROSURGICAL) ×1
ELECTRODE BLDE 4.0 EZ CLN MEGD (MISCELLANEOUS) ×1 IMPLANT
ELECTRODE REM PT RTRN 9FT ADLT (ELECTROSURGICAL) ×1 IMPLANT
GLOVE BIO SURGEON STRL SZ7.5 (GLOVE) ×1 IMPLANT
GLOVE BIO SURGEON STRL SZ8.5 (GLOVE) ×2 IMPLANT
GLOVE BIOGEL PI IND STRL 7.5 (GLOVE) ×1 IMPLANT
GLOVE BIOGEL PI IND STRL 8.5 (GLOVE) ×1 IMPLANT
GOWN STRL REUS W/ TWL LRG LVL3 (GOWN DISPOSABLE) IMPLANT
GOWN STRL REUS W/TWL 2XL LVL3 (GOWN DISPOSABLE) ×1 IMPLANT
GOWN STRL REUS W/TWL LRG LVL3 (GOWN DISPOSABLE) ×3
HANDPIECE INTERPULSE COAX TIP (DISPOSABLE) ×1
HEAD CERAMIC BIOLOX 36 (Head) IMPLANT
HOOD PEEL AWAY T7 (MISCELLANEOUS) ×2 IMPLANT
KIT BASIN OR (CUSTOM PROCEDURE TRAY) ×1 IMPLANT
KIT TURNOVER KIT B (KITS) ×1 IMPLANT
LINER ACETAB ~~LOC~~ D 36 (Liner) IMPLANT
MANIFOLD NEPTUNE II (INSTRUMENTS) ×1 IMPLANT
MARKER SKIN DUAL TIP RULER LAB (MISCELLANEOUS) ×2 IMPLANT
NDL SPNL 18GX3.5 QUINCKE PK (NEEDLE) ×1 IMPLANT
NEEDLE SPNL 18GX3.5 QUINCKE PK (NEEDLE) ×1 IMPLANT
NS IRRIG 1000ML POUR BTL (IV SOLUTION) ×1 IMPLANT
PACK TOTAL JOINT (CUSTOM PROCEDURE TRAY) IMPLANT
PAD ARMBOARD 7.5X6 YLW CONV (MISCELLANEOUS) ×2 IMPLANT
SAW OSC TIP CART 19.5X105X1.3 (SAW) ×1 IMPLANT
SEALER BIPOLAR AQUA 6.0 (INSTRUMENTS) IMPLANT
SET HNDPC FAN SPRY TIP SCT (DISPOSABLE) ×1 IMPLANT
SHELL ACET G7 3H 50 SZD (Shell) IMPLANT
SOLUTION PRONTOSAN WOUND 350ML (IRRIGATION / IRRIGATOR) ×1 IMPLANT
STEM POROUS COATED 11X142 (Stem) IMPLANT
SUT MNCRL AB 3-0 PS2 18 (SUTURE) IMPLANT
SUT MON AB 2-0 CT1 36 (SUTURE) ×1 IMPLANT
SUT VIC AB 2-0 CT1 27 (SUTURE) ×1
SUT VIC AB 2-0 CT1 TAPERPNT 27 (SUTURE) ×1 IMPLANT
SUT VLOC 180 0 24IN GS25 (SUTURE) ×1 IMPLANT
SYR 50ML LL SCALE MARK (SYRINGE) ×1 IMPLANT
TOWEL GREEN STERILE (TOWEL DISPOSABLE) ×1 IMPLANT
TUBE SUCT ARGYLE STRL (TUBING) IMPLANT
WATER STERILE IRR 1000ML POUR (IV SOLUTION) ×3 IMPLANT

## 2023-01-07 NOTE — ED Notes (Signed)
ED TO INPATIENT HANDOFF REPORT  ED Nurse Name and Phone #: Francene Castle  S Name/Age/Gender Kristen Orr 77 y.o. female Room/Bed: 002C/002C  Code Status   Code Status: Full Code  Home/SNF/Other Home Patient oriented to: self, place, time, and situation Is this baseline? Yes   Triage Complete: Triage complete  Chief Complaint Hip fracture (HCC) [S72.009A]  Triage Note Pt here from home with c/o right hip after tripping over a vacuum, no loc no thinners     Allergies Allergies  Allergen Reactions   Tape Hives   Egg-Derived Products    Penicillins Rash, Hives and Other (See Comments)    Level of Care/Admitting Diagnosis ED Disposition     ED Disposition  Admit   Condition  --   Comment  Hospital Area: [facility] [[id number]]  Level of Care: Med-Surg [16]  May admit patient to [facility] or [facility] if equivalent level of care is available:: No  Covid Evaluation: Asymptomatic - no recent exposure (last 10 days) testing not required  Diagnosis: Hip fracture [facility]) [[id number]]  Admitting Physician: Anselm Jungling [[id number]]  Attending Physician: Anselm Jungling [[id number]]  Certification:: I certify this patient will need inpatient services for at least 2 midnights  Expected Medical Readiness: 01/08/2023          B Medical/Surgery History Past Medical History:  Diagnosis Date   Allergy    Hyperlipidemia    Hypertension    Varicose veins    Past Surgical History:  Procedure Laterality Date   PITUITARY SURGERY     TONSILLECTOMY     VARICOSE VEIN SURGERY       A IV Location/Drains/Wounds Patient Lines/Drains/Airways Status     Active Line/Drains/Airways     Name Placement date Placement time Site Days   Peripheral IV 01/06/23 20 G Left Antecubital 01/06/23  1712  Antecubital  1   Urethral Catheter Thuy, PCT Straight-tip 14 Fr. 01/07/23  0323  Straight-tip  less than 1            Intake/Output Last 24 hours  Intake/Output  Summary (Last 24 hours) at 01/07/2023 0436 Last data filed at 01/07/2023 0325 Gross per 24 hour  Intake 500 ml  Output 900 ml  Net -400 ml    Labs/Imaging Results for orders placed or performed during the hospital encounter of 01/06/23 (from the past 48 hour(s))  Basic metabolic panel     Status: Abnormal   Collection Time: 01/06/23  5:08 PM  Result Value Ref Range   Sodium 131 (L) 135 - 145 mmol/L   Potassium 3.1 (L) 3.5 - 5.1 mmol/L   Chloride 95 (L) 98 - 111 mmol/L   CO2 24 22 - 32 mmol/L   Glucose, Bld 90 70 - 99 mg/dL    Comment: Glucose reference range applies only to samples taken after fasting for at least 8 hours.   BUN 23 8 - 23 mg/dL   Creatinine, Ser 9.32 0.44 - 1.00 mg/dL   Calcium 9.0 8.9 - 67.1 mg/dL   GFR, Estimated >24 >58 mL/min    Comment: (NOTE) Calculated using the CKD-EPI Creatinine Equation (2021)    Anion gap 12 5 - 15    Comment: Performed at [facility], [address] [place], [place] [address]  CBC with Differential     Status: Abnormal   Collection Time: 01/06/23  5:08 PM  Result Value Ref Range   WBC 15.3 (H) 4.0 - 10.5 K/uL  RBC 4.48 3.87 - 5.11 MIL/uL   Hemoglobin 13.3 12.0 - 15.0 g/dL   HCT 16.1 09.6 - 04.5 %   MCV 88.8 80.0 - 100.0 fL   MCH 29.7 26.0 - 34.0 pg   MCHC 33.4 30.0 - 36.0 g/dL   RDW 40.9 81.1 - 91.4 %   Platelets 294 150 - 400 K/uL   nRBC 0.0 0.0 - 0.2 %   Neutrophils Relative % 82 %   Neutro Abs 12.7 (H) 1.7 - 7.7 K/uL   Lymphocytes Relative 10 %   Lymphs Abs 1.5 0.7 - 4.0 K/uL   Monocytes Relative 5 %   Monocytes Absolute 0.7 0.1 - 1.0 K/uL   Eosinophils Relative 1 %   Eosinophils Absolute 0.1 0.0 - 0.5 K/uL   Basophils Relative 1 %   Basophils Absolute 0.1 0.0 - 0.1 K/uL   Immature Granulocytes 1 %   Abs Immature Granulocytes 0.11 (H) 0.00 - 0.07 K/uL    Comment: Performed at [facility], [address] [place], [place] [address]  Protime-INR     Status: None   Collection Time: 01/06/23  5:08 PM   Result Value Ref Range   Prothrombin Time 13.4 11.4 - 15.2 seconds   INR 1.0 0.8 - 1.2    Comment: (NOTE) INR goal varies based on device and disease states. Performed at [facility], [address] [place], [place] [address]   Type and screen [facility]     Status: None   Collection Time: 01/06/23  5:08 PM  Result Value Ref Range   ABO/RH(D) A POS    Antibody Screen NEG    Sample Expiration      01/09/2023,2359 Performed at [facility], [address], [place], [place] [address]   ABO/Rh     Status: None   Collection Time: 01/07/23  3:03 AM  Result Value Ref Range   ABO/RH(D)      A POS Performed at [facility], [address], [place], [place] [address]    DG Knee Right Port  Result Date: 01/06/2023 CLINICAL DATA:  Known right femoral neck fracture knee pain, initial encounter EXAM: PORTABLE RIGHT KNEE - 2 VIEW COMPARISON:  None Available. FINDINGS: Right knee replacement is noted. No acute bony abnormality is noted. IMPRESSION: No acute abnormality noted. Electronically Signed   By: Alcide Clever M.D.   On: 01/06/2023 21:53   DG Hip Unilat W or Wo Pelvis 2-3 Views Right  Result Date: 01/06/2023 CLINICAL DATA:  Right hip pain after fall, unable to bear weight. EXAM: DG HIP (WITH OR WITHOUT PELVIS) 2-3V RIGHT COMPARISON:  None Available. FINDINGS: Displaced fracture of the femoral neck. Slight medial displacement of the distal femur. The femoral head remains seated in the acetabulum. No hip dislocation. No additional pelvis of pelvic ring. Pubic rami are intact. No pubic symphyseal or sacroiliac diastasis. IMPRESSION: Displaced right femoral neck fracture. Electronically Signed   By: Narda Rutherford M.D.   On: 01/06/2023 18:06    Pending Labs Unresulted Labs (From admission, onward)    None       Vitals/Pain Today's Vitals   01/06/23 2115 01/06/23 2147 01/07/23 0200 01/07/23 0250  BP: 122/71  113/69   Pulse: 86  90   Resp: 17   11   Temp:      TempSrc:      SpO2: 91%  90%   Weight:      Height:  PainSc:  (S) 8   (S) 8     Isolation Precautions No active isolations  Medications Medications  povidone-iodine 10 % swab 2 Application (has no administration in time range)  chlorhexidine (HIBICLENS) 4 % liquid 4 Application (has no administration in time range)  povidone-iodine 10 % swab 2 Application (has no administration in time range)  ceFAZolin (ANCEF) IVPB 2g/100 mL premix (has no administration in time range)  tranexamic acid (CYKLOKAPRON) IVPB 1,000 mg (has no administration in time range)  acetaminophen (TYLENOL) tablet 1,000 mg (has no administration in time range)  Chlorhexidine Gluconate Cloth 2 % PADS 6 each (has no administration in time range)    And  Chlorhexidine Gluconate Cloth 2 % PADS 6 each (has no administration in time range)  influenza vaccine adjuvanted (FLUAD) injection 0.5 mL (has no administration in time range)  HYDROcodone-acetaminophen (NORCO/VICODIN) 5-325 MG per tablet 1-2 tablet (1 tablet Oral Given 01/06/23 2148)  senna (SENOKOT) tablet 8.6 mg (8.6 mg Oral Given 01/06/23 2127)  morphine (PF) 2 MG/ML injection 1 mg (has no administration in time range)  chlorthalidone (HYGROTON) tablet 25 mg (has no administration in time range)  ezetimibe (ZETIA) tablet 10 mg (has no administration in time range)  irbesartan (AVAPRO) tablet 150 mg (has no administration in time range)  citalopram (CELEXA) tablet 20 mg (has no administration in time range)  oxyCODONE-acetaminophen (PERCOCET/ROXICET) 5-325 MG per tablet 1 tablet (1 tablet Oral Given 01/06/23 1547)  ondansetron (ZOFRAN) injection 4 mg (4 mg Intravenous Given 01/06/23 1712)  feeding supplement (ENSURE PRE-SURGERY) liquid 296 mL (296 mLs Oral Given 01/06/23 2327)  potassium chloride SA (KLOR-CON M) CR tablet 40 mEq (40 mEq Oral Given 01/06/23 2128)  sodium chloride 0.9 % bolus 500 mL (0 mLs Intravenous Stopped 01/07/23 0318)     Mobility walks     Focused Assessments    R Recommendations: See Admitting Provider Note  Report given to:   Additional Notes: husband at bedside

## 2023-01-07 NOTE — Transfer of Care (Signed)
Immediate Anesthesia Transfer of Care Note  Patient: Kristen Orr  Procedure(s) Performed: TOTAL HIP ARTHROPLASTY ANTERIOR APPROACH (Right: Hip)  Patient Location: PACU  Anesthesia Type:General  Level of Consciousness: awake, alert , and oriented  Airway & Oxygen Therapy: Patient Spontanous Breathing and Patient connected to face mask oxygen  Post-op Assessment: Report given to RN and Post -op Vital signs reviewed and stable  Post vital signs: Reviewed and stable  Last Vitals:  Vitals Value Taken Time  BP 112/70 01/07/23 1101  Temp    Pulse 98 01/07/23 1106  Resp 15 01/07/23 1106  SpO2 89 % 01/07/23 1106  Vitals shown include unfiled device data.  Last Pain:  Vitals:   01/07/23 0700  TempSrc: Oral  PainSc:          Complications: No notable events documented.

## 2023-01-07 NOTE — Plan of Care (Signed)
°  Problem: Activity: °Goal: Risk for activity intolerance will decrease °Outcome: Progressing °  °Problem: Elimination: °Goal: Will not experience complications related to urinary retention °Outcome: Progressing °  °Problem: Pain Managment: °Goal: General experience of comfort will improve °Outcome: Progressing °  °

## 2023-01-07 NOTE — Plan of Care (Signed)
  Problem: Education: Goal: Knowledge of General Education information will improve Description: Including pain rating scale, medication(s)/side effects and non-pharmacologic comfort measures Outcome: Progressing   Problem: Nutrition: Goal: Adequate nutrition will be maintained Outcome: Progressing   Problem: Coping: Goal: Level of anxiety will decrease Outcome: Progressing   Problem: Elimination: Goal: Will not experience complications related to bowel motility Outcome: Progressing   Problem: Pain Managment: Goal: General experience of comfort will improve Outcome: Progressing   Problem: Safety: Goal: Ability to remain free from injury will improve Outcome: Progressing   Problem: Skin Integrity: Goal: Risk for impaired skin integrity will decrease Outcome: Progressing   

## 2023-01-07 NOTE — Anesthesia Preprocedure Evaluation (Signed)
Anesthesia Evaluation  Patient identified by MRN, date of birth, ID band Patient awake    Reviewed: Allergy & Precautions, NPO status , Patient's Chart, lab work & pertinent test results  Airway Mallampati: II  TM Distance: >3 FB Neck ROM: Full    Dental  (+) Dental Advisory Given   Pulmonary former smoker   breath sounds clear to auscultation       Cardiovascular hypertension, Pt. on medications  Rhythm:Regular Rate:Normal     Neuro/Psych negative neurological ROS     GI/Hepatic negative GI ROS, Neg liver ROS,,,  Endo/Other  negative endocrine ROS    Renal/GU negative Renal ROS     Musculoskeletal   Abdominal   Peds  Hematology negative hematology ROS (+)   Anesthesia Other Findings   Reproductive/Obstetrics                             Anesthesia Physical Anesthesia Plan  ASA: 2  Anesthesia Plan: General   Post-op Pain Management: Tylenol PO (pre-op)*   Induction: Intravenous  PONV Risk Score and Plan: 3 and Dexamethasone, Ondansetron and Treatment may vary due to age or medical condition  Airway Management Planned: Oral ETT  Additional Equipment:   Intra-op Plan:   Post-operative Plan: Extubation in OR  Informed Consent: I have reviewed the patients History and Physical, chart, labs and discussed the procedure including the risks, benefits and alternatives for the proposed anesthesia with the patient or authorized representative who has indicated his/her understanding and acceptance.     Dental advisory given  Plan Discussed with: CRNA  Anesthesia Plan Comments:        Anesthesia Quick Evaluation

## 2023-01-07 NOTE — Progress Notes (Signed)
PROGRESS NOTE  Kristen Orr ZOX:[id number] DOB: 03-Nov-1945   PCP: Carylon Perches, MD  Patient is from: Home.  DOA: 01/06/2023 LOS: 1  Chief complaints Chief Complaint  Patient presents with   Fall   Hip Pain     Brief Narrative / Interim history: 77 year old F with PMH of HTN, HLD and depression presenting with right hip pain after she tripped over her vacuum cleaner and fell at home, and found to have displaced right femoral neck fracture.  She also has some hypokalemia, hyponatremia and leukocytosis.  Orthopedic surgery consulted.  Patient underwent right THA by Dr. Linna Caprice on 10/20.  Subjective: Seen and examined this afternoon after she returned from OR.  Feels well postoperatively.  No complaints.  Objective: Vitals:   01/07/23 0400 01/07/23 0522 01/07/23 0700 01/07/23 0717  BP: (!) 144/74 135/84    Pulse: 81 85    Resp: 13 16    Temp:   99.2 F (37.3 C)   TempSrc:   Oral   SpO2: 92% 96%    Weight:    64.4 kg  Height:    5' 4.02" (1.626 m)    Examination:  GENERAL: No apparent distress.  Nontoxic. HEENT: MMM.  Vision and hearing grossly intact.  NECK: Supple.  No apparent JVD.  RESP:  No IWOB.  Fair aeration bilaterally. CVS:  RRR. Heart sounds normal.  ABD/GI/GU: BS+. Abd soft, NTND.  Foley catheter in place. MSK/EXT:. No apparent deformity. No edema.  SKIN: Surgical dressing DCI. NEURO: Awake, alert and oriented appropriately.  No apparent focal neuro deficit. PSYCH: Calm. Normal affect.   Procedures:  10/20-right THA by Dr. Linna Caprice  Microbiology summarized: MRSA PCR screen nonreactive  Assessment and plan: Accidental fall at home: tripped over a vacuum cleaner and fell Displaced right femoral neck fracture due to mechanical fall -S/p right THA by Dr. Linna Caprice on 10/20 -Multimodal pain regimen per Ortho -DVT prophylaxis per Ortho -PT/OT  Hyponatremia/hypokalemia: Likely due to chlorthalidone -Hold chlorthalidone -IV labetalol as needed for  blood pressure -Follow labs from this morning.  Not drawn yet -Check magnesium   Essential hypertension: Normotensive this morning -Holding chlorthalidone due to electrolyte abnormality -IV labetalol as needed  History of depression: Stable -Continue home meds.  Acute urinary retention: Patient urinary tension in ED with bladder scan showing about 830 cc.  It seems she had 900 cc urine after Foley insertion. -Continue Foley catheter for today -Voiding trial in 1 to 2 days once she is out of bed  Body mass index is 24.36 kg/m.          DVT prophylaxis:  SCDs Start: 01/06/23 2046  Code Status: Full code Family Communication: Updated patient's husband at bedside Level of care: Med-Surg Status is: Inpatient Remains inpatient appropriate because: Right hip fracture   Final disposition: TBD Consultants:  Orthopedic surgery  35 minutes with more than 50% spent in reviewing records, counseling patient/family and coordinating care.   Sch Meds:  Scheduled Meds:  Chlorhexidine Gluconate Cloth  6 each Topical Once   [MAR Hold] citalopram  20 mg Oral Daily   [MAR Hold] ezetimibe  10 mg Oral Daily   [MAR Hold] influenza vaccine adjuvanted  0.5 mL Intramuscular Tomorrow-1000   [MAR Hold] irbesartan  150 mg Oral Daily   povidone-iodine  2 Application Topical Once   [MAR Hold] senna  1 tablet Oral BID   Continuous Infusions:  sodium chloride     PRN Meds:.sodium chloride, amisulpride, fentaNYL (SUBLIMAZE) injection, [MAR Hold] HYDROcodone-acetaminophen, labetalol, [  MAR Hold]  morphine injection, sodium chloride irrigation  Antimicrobials: Anti-infectives (From admission, onward)    Start     Dose/Rate Route Frequency Ordered Stop   01/07/23 0705  ceFAZolin (ANCEF) 2-4 GM/100ML-% IVPB       Note to Pharmacy: Kathrene Bongo D: cabinet override      01/07/23 0705 01/07/23 0849   01/07/23 0600  ceFAZolin (ANCEF) IVPB 2g/100 mL premix        2 g 200 mL/hr over 30 Minutes  Intravenous On call to O.R. 01/07/23 1610 01/07/23 0849        I have personally reviewed the following labs and images: CBC: Recent Labs  Lab 01/06/23 1708  WBC 15.3*  NEUTROABS 12.7*  HGB 13.3  HCT 39.8  MCV 88.8  PLT 294   BMP &GFR Recent Labs  Lab 01/06/23 1708  NA 131*  K 3.1*  CL 95*  CO2 24  GLUCOSE 90  BUN 23  CREATININE 0.74  CALCIUM 9.0   Estimated Creatinine Clearance: 52.5 mL/min (by C-G formula based on SCr of 0.74 mg/dL). Liver & Pancreas: No results for input(s): "AST", "ALT", "ALKPHOS", "BILITOT", "PROT", "ALBUMIN" in the last 168 hours. No results for input(s): "LIPASE", "AMYLASE" in the last 168 hours. No results for input(s): "AMMONIA" in the last 168 hours. Diabetic: No results for input(s): "HGBA1C" in the last 72 hours. No results for input(s): "GLUCAP" in the last 168 hours. Cardiac Enzymes: No results for input(s): "CKTOTAL", "CKMB", "CKMBINDEX", "TROPONINI" in the last 168 hours. No results for input(s): "PROBNP" in the last 8760 hours. Coagulation Profile: Recent Labs  Lab 01/06/23 1708  INR 1.0   Thyroid Function Tests: No results for input(s): "TSH", "T4TOTAL", "FREET4", "T3FREE", "THYROIDAB" in the last 72 hours. Lipid Profile: No results for input(s): "CHOL", "HDL", "LDLCALC", "TRIG", "CHOLHDL", "LDLDIRECT" in the last 72 hours. Anemia Panel: No results for input(s): "VITAMINB12", "FOLATE", "FERRITIN", "TIBC", "IRON", "RETICCTPCT" in the last 72 hours. Urine analysis: No results found for: "COLORURINE", "APPEARANCEUR", "LABSPEC", "PHURINE", "GLUCOSEU", "HGBUR", "BILIRUBINUR", "KETONESUR", "PROTEINUR", "UROBILINOGEN", "NITRITE", "LEUKOCYTESUR" Sepsis Labs: Invalid input(s): "PROCALCITONIN", "LACTICIDVEN"  Microbiology: Recent Results (from the past 240 hour(s))  Surgical pcr screen     Status: None   Collection Time: 01/07/23  5:42 AM   Specimen: Nasal Mucosa; Nasal Swab  Result Value Ref Range Status   MRSA, PCR NEGATIVE  NEGATIVE Final   Staphylococcus aureus NEGATIVE NEGATIVE Final    Comment: (NOTE) The Xpert SA Assay (FDA approved for NASAL specimens in patients 75 years of age and older), is one component of a comprehensive surveillance program. It is not intended to diagnose infection nor to guide or monitor treatment. Performed at [facility], [address], [place], [place] [address]     Radiology Studies: DG Knee Right Port  Result Date: 01/06/2023 CLINICAL DATA:  Known right femoral neck fracture knee pain, initial encounter EXAM: PORTABLE RIGHT KNEE - 2 VIEW COMPARISON:  None Available. FINDINGS: Right knee replacement is noted. No acute bony abnormality is noted. IMPRESSION: No acute abnormality noted. Electronically Signed   By: Alcide Clever M.D.   On: 01/06/2023 21:53   DG Hip Unilat W or Wo Pelvis 2-3 Views Right  Result Date: 01/06/2023 CLINICAL DATA:  Right hip pain after fall, unable to bear weight. EXAM: DG HIP (WITH OR WITHOUT PELVIS) 2-3V RIGHT COMPARISON:  None Available. FINDINGS: Displaced fracture of the femoral neck. Slight medial displacement of the distal femur. The femoral head remains seated in the  acetabulum. No hip dislocation. No additional pelvis of pelvic ring. Pubic rami are intact. No pubic symphyseal or sacroiliac diastasis. IMPRESSION: Displaced right femoral neck fracture. Electronically Signed   By: Narda Rutherford M.D.   On: 01/06/2023 18:06      Kahron Kauth T. Preslee Regas [facility]  If 7PM-7AM, please contact night-coverage [email or web address] 01/07/2023, 10:24 AM

## 2023-01-07 NOTE — Anesthesia Postprocedure Evaluation (Signed)
Anesthesia Post Note  Patient: Kristen Orr  Procedure(s) Performed: TOTAL HIP ARTHROPLASTY ANTERIOR APPROACH (Right: Hip)     Patient location during evaluation: PACU Anesthesia Type: General Level of consciousness: awake and alert Pain management: pain level controlled Vital Signs Assessment: post-procedure vital signs reviewed and stable Respiratory status: spontaneous breathing, nonlabored ventilation, respiratory function stable and patient connected to nasal cannula oxygen Cardiovascular status: blood pressure returned to baseline and stable Postop Assessment: no apparent nausea or vomiting Anesthetic complications: no  No notable events documented.  Last Vitals:  Vitals:   01/07/23 1200 01/07/23 1220  BP:  123/72  Pulse: 100 98  Resp: 13 18  Temp: 36.4 C 36.7 C  SpO2: 92% 92%    Last Pain:  Vitals:   01/07/23 1200  TempSrc:   PainSc: 6                  Kennieth Rad

## 2023-01-07 NOTE — Discharge Instructions (Signed)
? ?Dr. Brian Swinteck ?Joint Replacement Specialist ?[facility] ?[address] ?[place], [place] [id number] ?[phone] ? ? ?TOTAL HIP REPLACEMENT POSTOPERATIVE DIRECTIONS ? ? ? ?Hip Rehabilitation, Guidelines Following Surgery  ? ?WEIGHT BEARING ?Weight bearing as tolerated with assist device (walker, cane, etc) as directed, use it as long as suggested by your surgeon or therapist, typically at least 4-6 weeks. ? ?The results of a hip operation are greatly improved after range of motion and muscle strengthening exercises. Follow all safety measures which are given to protect your hip. If any of these exercises cause increased pain or swelling in your joint, decrease the amount until you are comfortable again. Then slowly increase the exercises. Call your caregiver if you have problems or questions.  ? ?HOME CARE INSTRUCTIONS  ?Most of the following instructions are designed to prevent the dislocation of your new hip.  ?Remove items at home which could result in a fall. This includes throw rugs or furniture in walking pathways.  ?Continue medications as instructed at time of discharge. ?You may have some home medications which will be placed on hold until you complete the course of blood thinner medication. ?You may start showering once you are discharged home. Do not remove your dressing. ?Do not put on socks or shoes without following the instructions of your caregivers.   ?Sit on chairs with arms. Use the chair arms to help push yourself up when arising.  ?Arrange for the use of a toilet seat elevator so you are not sitting low.  ?Walk with walker as instructed.  ?You may resume a sexual relationship in one month or when given the OK by your caregiver.  ?Use walker as long as suggested by your caregivers.  ?You may put full weight on your legs and walk as much as is comfortable. ?Avoid periods of inactivity such as sitting longer than an hour when not asleep. This helps prevent blood  clots.  ?You may return to work once you are cleared by your surgeon.  ?Do not drive a car for 6 weeks or until released by your surgeon.  ?Do not drive while taking narcotics.  ?Wear elastic stockings for two weeks following surgery during the day but you may remove then at night.  ?Make sure you keep all of your appointments after your operation with all of your doctors and caregivers. You should call the office at the above phone number and make an appointment for approximately two weeks after the date of your surgery. ?Please pick up a stool softener and laxative for home use as long as you are requiring pain medications. ?ICE to the affected hip every three hours for 30 minutes at a time and then as needed for pain and swelling. Continue to use ice on the hip for pain and swelling from surgery. You may notice swelling that will progress down to the foot and ankle.  This is normal after surgery.  Elevate the leg when you are not up walking on it.   ?It is important for you to complete the blood thinner medication as prescribed by your doctor. ?Continue to use the breathing machine which will help keep your temperature down.  It is common for your temperature to cycle up and down following surgery, especially at night when you are not up moving around and exerting yourself.  The breathing machine keeps your lungs expanded and your temperature down. ? ?RANGE OF MOTION AND STRENGTHENING EXERCISES  ?These exercises are designed to help you   keep full movement of your hip joint. Follow your caregiver's or physical therapist's instructions. Perform all exercises about fifteen times, three times per day or as directed. Exercise both hips, even if you have had only one joint replacement. These exercises can be done on a training (exercise) mat, on the floor, on a table or on a bed. Use whatever works the best and is most comfortable for you. Use music or television while you are exercising so that the exercises are a  pleasant break in your day. This will make your life better with the exercises acting as a break in routine you can look forward to.  ?Lying on your back, slowly slide your foot toward your buttocks, raising your knee up off the floor. Then slowly slide your foot back down until your leg is straight again.  ?Lying on your back spread your legs as far apart as you can without causing discomfort.  ?Lying on your side, raise your upper leg and foot straight up from the floor as far as is comfortable. Slowly lower the leg and repeat.  ?Lying on your back, tighten up the muscle in the front of your thigh (quadriceps muscles). You can do this by keeping your leg straight and trying to raise your heel off the floor. This helps strengthen the largest muscle supporting your knee.  ?Lying on your back, tighten up the muscles of your buttocks both with the legs straight and with the knee bent at a comfortable angle while keeping your heel on the floor.  ? ?SKILLED REHAB INSTRUCTIONS: ?If the patient is transferred to a skilled rehab facility following release from the hospital, a list of the current medications will be sent to the facility for the patient to continue.  When discharged from the skilled rehab facility, please have the facility set up the patient's Home Health Physical Therapy prior to being released. Also, the skilled facility will be responsible for providing the patient with their medications at time of release from the facility to include their pain medication and their blood thinner medication. If the patient is still at the rehab facility at time of the two week follow up appointment, the skilled rehab facility will also need to assist the patient in arranging follow up appointment in our office and any transportation needs. ? ?POST-OPERATIVE OPIOID TAPER INSTRUCTIONS: ?It is important to wean off of your opioid medication as soon as possible. If you do not need pain medication after your surgery it is ok  to stop day one. ?Opioids include: ?Codeine, Hydrocodone(Norco, Vicodin), Oxycodone(Percocet, oxycontin) and hydromorphone amongst others.  ?Long term and even short term use of opiods can cause: ?Increased pain response ?Dependence ?Constipation ?Depression ?Respiratory depression ?And more.  ?Withdrawal symptoms can include ?Flu like symptoms ?Nausea, vomiting ?And more ?Techniques to manage these symptoms ?Hydrate well ?Eat regular healthy meals ?Stay active ?Use relaxation techniques(deep breathing, meditating, yoga) ?Do Not substitute Alcohol to help with tapering ?If you have been on opioids for less than two weeks and do not have pain than it is ok to stop all together.  ?Plan to wean off of opioids ?This plan should start within one week post op of your joint replacement. ?Maintain the same interval or time between taking each dose and first decrease the dose.  ?Cut the total daily intake of opioids by one tablet each day ?Next start to increase the time between doses. ?The last dose that should be eliminated is the evening dose.  ? ? ?MAKE   SURE YOU:  ?Understand these instructions.  ?Will watch your condition.  ?Will get help right away if you are not doing well or get worse. ? ?Pick up stool softner and laxative for home use following surgery while on pain medications. ?Do not remove your dressing. ?The dressing is waterproof--it is OK to take showers. ?Continue to use ice for pain and swelling after surgery. ?Do not use any lotions or creams on the incision until instructed by your surgeon. ?Total Hip Protocol. ? ?

## 2023-01-07 NOTE — ED Notes (Signed)
Pt informed this Clinical research associate that she has not voided since coming to the ED, and is unable to use the purewick. Bladder scan volume read and admitting MD notified.

## 2023-01-07 NOTE — Interval H&P Note (Signed)
History and Physical Interval Note:  01/07/2023 8:29 AM  Kristen Orr  has presented today for surgery, with the diagnosis of RIGHT FEMORAL NECK FRACTURE.  The various methods of treatment have been discussed with the patient and family. After consideration of risks, benefits and other options for treatment, the patient has consented to  Procedure(s): TOTAL HIP ARTHROPLASTY ANTERIOR APPROACH (Right) as a surgical intervention.  The patient's history has been reviewed, patient examined, no change in status, stable for surgery.  I have reviewed the patient's chart and labs.  Questions were answered to the patient's satisfaction.    The risks, benefits, and alternatives were discussed with the patient. There are risks associated with the surgery including, but not limited to, problems with anesthesia (death), infection, instability (giving out of the joint), dislocation, differences in leg length/angulation/rotation, fracture of bones, loosening or failure of implants, hematoma (blood accumulation) which may require surgical drainage, blood clots, pulmonary embolism, nerve injury (foot drop and lateral thigh numbness), and blood vessel injury. The patient understands these risks and elects to proceed.    Iline Oven Lavonia Eager

## 2023-01-07 NOTE — TOC CAGE-AID Note (Signed)
Transition of Care [facility]) - CAGE-AID Screening  Patient Details  Name: Kristen Orr MRN: [id number] Date of Birth: [birth year]  Clinical Narrative:  Patient endorses occasional alcohol use, denies any substance use. Patient does not feel she needs substance abuse resources at this time.  CAGE-AID Screening:   Have You Ever Felt You Ought to Cut Down on Your Drinking or Drug Use?: No Have People Annoyed You By Critizing Your Drinking Or Drug Use?: No Have You Felt Bad Or Guilty About Your Drinking Or Drug Use?: No Have You Ever Had a Drink or Used Drugs First Thing In The Morning to Steady Your Nerves or to Get Rid of a Hangover?: No CAGE-AID Score: 0  Substance Abuse Education Offered: No

## 2023-01-07 NOTE — Anesthesia Procedure Notes (Signed)
Procedure Name: Intubation Date/Time: 01/07/2023 8:43 AM  Performed by: Alwyn Ren, CRNAPre-anesthesia Checklist: Patient identified, Emergency Drugs available, Suction available and Patient being monitored Patient Re-evaluated:Patient Re-evaluated prior to induction Oxygen Delivery Method: Circle system utilized Preoxygenation: Pre-oxygenation with 100% oxygen Induction Type: IV induction Ventilation: Mask ventilation without difficulty Laryngoscope Size: Miller and 2 Grade View: Grade II Tube type: Oral Tube size: 7.0 mm Number of attempts: 1 Airway Equipment and Method: Stylet Placement Confirmation: ETT inserted through vocal cords under direct vision, positive ETCO2 and breath sounds checked- equal and bilateral Secured at: 22 cm Tube secured with: Tape Dental Injury: Teeth and Oropharynx as per pre-operative assessment

## 2023-01-07 NOTE — Op Note (Signed)
OPERATIVE REPORT  SURGEON: Samson Frederic, MD   ASSISTANT: Clint Bolder, PA-C  PREOPERATIVE DIAGNOSIS: Displaced Right femoral neck fracture.   POSTOPERATIVE DIAGNOSIS: Displaced Right femoral neck fracture.   PROCEDURE: Right total hip arthroplasty, anterior approach.   IMPLANTS: Biomet Taperloc Reduced Distal stem, size 11 x 142 mm, high offset. Biomet G7 OsseoTi Cup, size 50 mm. Biomet Vivacit-E liner, size 36 mm, D, neutral. Biomet Biolox ceramic head ball, size 36 - 3 mm.  ANESTHESIA:  General  ANTIBIOTICS: 2g ancef.  ESTIMATED BLOOD LOSS:-250 mL    DRAINS: None.  COMPLICATIONS: None   CONDITION: PACU - hemodynamically stable.   BRIEF CLINICAL NOTE: Kristen Orr is a 77 y.o. female with a displaced Right femoral neck fracture. The patient was admitted to the hospitalist service and underwent perioperative risk stratification and medical optimization. The risks, benefits, and alternatives to total hip arthroplasty were explained, and the patient elected to proceed.  PROCEDURE IN DETAIL: The patient was taken to the operating room and general anesthesia was induced on the hospital bed.  The patient was then positioned on the Hana table.  All bony prominences were well padded.  The hip was prepped and draped in the normal sterile surgical fashion.  A time-out was called verifying side and site of surgery. Antibiotics were given within 60 minutes of beginning the procedure.   Bikini incision was made, and the direct anterior approach to the hip was performed through the Hueter interval.  Lateral femoral circumflex vessels were treated with the Auqumantys. The anterior capsule was exposed and an inverted T capsulotomy was made.  Fracture hematoma was encountered and evacuated. The patient was found to have a comminuted Right subcapital femoral neck fracture.  I freshened the femoral neck cut with a saw.  I removed the femoral neck fragment.  A corkscrew was placed into the head and  the head was removed.  This was passed to the back table and was measured. The pubofemoral ligament was released subperiosteally to the lesser trochanter.  Acetabular exposure was achieved, and the pulvinar and labrum were excised. Sequential reaming of the acetabulum was then performed up to a size 49 mm reamer under direct visulization. A 50 mm cup was then opened and impacted into place at approximately 40 degrees of abduction and 20 degrees of anteversion. The final polyethylene liner was impacted into place and acetabular osteophytes were removed.    I then gained femoral exposure taking care to protect the abductors and greater trochanter.  This was performed using standard external rotation, extension, and adduction.  A cookie cutter was used to enter the femoral canal, and then the femoral canal finder was placed.  Sequential broaching was performed up to a size 11.  Calcar planer was used on the femoral neck remnant.  I placed a high offset neck and a trial head ball.  The hip was reduced.  Leg lengths and offset were checked fluoroscopically.  The hip was dislocated and trial components were removed.  The final implants were placed, and the hip was reduced.  Fluoroscopy was used to confirm component position and leg lengths.  At 90 degrees of external rotation and full extension, the hip was stable to an anterior directed force.   The wound was copiously irrigated with Irrisept solution and normal saline using pulse lavage.  Marcaine solution was injected into the periarticular soft tissue.  The wound was closed in layers using #1 Stratafix for the fascia, 2-0 Vicryl for the subcutaneous fat, 2-0 Monocryl  for the deep dermal layer, 3-0 running Monocryl subcuticular stitch, and Dermabond for the skin.  Once the glue was fully dried, an Aquacell Ag dressing was applied.  The patient was transported to the recovery room in stable condition.  Sponge, needle, and instrument counts were correct at the end  of the case x2.  The patient tolerated the procedure well and there were no known complications.  The aquamantis was utilized for this case to help facilitate better hemostasis as patient was felt to be at increased risk of bleeding because of complex case requiring increased OR time and/or exposure.  A oscillating saw tip was utilized for this case to prevent damage to the soft tissue structures such as muscles, ligaments and tendons, and to ensure accurate bone cuts. This patient was at increased risk for above structures due to  minimally invasive approach.  Please note that a surgical assistant was a medical necessity for this procedure to perform it in a safe and expeditious manner. Assistant was necessary to provide appropriate retraction of vital neurovascular structures, to prevent femoral fracture, and to allow for anatomic placement of the prosthesis.

## 2023-01-08 DIAGNOSIS — S72001A Fracture of unspecified part of neck of right femur, initial encounter for closed fracture: Secondary | ICD-10-CM | POA: Diagnosis not present

## 2023-01-08 LAB — RENAL FUNCTION PANEL
Albumin: 3 g/dL — ABNORMAL LOW (ref 3.5–5.0)
Anion gap: 8 (ref 5–15)
BUN: 16 mg/dL (ref 8–23)
CO2: 26 mmol/L (ref 22–32)
Calcium: 8.2 mg/dL — ABNORMAL LOW (ref 8.9–10.3)
Chloride: 96 mmol/L — ABNORMAL LOW (ref 98–111)
Creatinine, Ser: 0.92 mg/dL (ref 0.44–1.00)
GFR, Estimated: >60 mL/min (ref >60)
Glucose, Bld: 109 mg/dL — ABNORMAL HIGH (ref 70–99)
Phosphorus: 3.2 mg/dL (ref 2.5–4.6)
Potassium: 3.3 mmol/L — ABNORMAL LOW (ref 3.5–5.1)
Sodium: 130 mmol/L — ABNORMAL LOW (ref 135–145)

## 2023-01-08 LAB — CBC
HCT: 28.4 % — ABNORMAL LOW (ref 36.0–46.0)
Hemoglobin: 9.4 g/dL — ABNORMAL LOW (ref 12.0–15.0)
MCH: 29.9 pg (ref 26.0–34.0)
MCHC: 33.1 g/dL (ref 30.0–36.0)
MCV: 90.4 fL (ref 80.0–100.0)
Platelets: 215 10*3/uL (ref 150–400)
RBC: 3.14 MIL/uL — ABNORMAL LOW (ref 3.87–5.11)
RDW: 13.5 % (ref 11.5–15.5)
WBC: 12 10*3/uL — ABNORMAL HIGH (ref 4.0–10.5)
nRBC: 0 % (ref 0.0–0.2)

## 2023-01-08 LAB — MAGNESIUM: Magnesium: 1.8 mg/dL (ref 1.7–2.4)

## 2023-01-08 MED ORDER — ONDANSETRON HCL 4 MG PO TABS: 4.0000 mg | ORAL_TABLET | Freq: Three times a day (TID) | ORAL | 0 refills | Status: AC | PRN

## 2023-01-08 MED ORDER — ASPIRIN 81 MG PO CHEW
81.0000 mg | CHEWABLE_TABLET | Freq: Two times a day (BID) | ORAL | 0 refills | Status: AC
Start: 2023-01-08 — End: 2023-02-22

## 2023-01-08 MED ORDER — METHOCARBAMOL 500 MG PO TABS: 500.0000 mg | ORAL_TABLET | Freq: Four times a day (QID) | ORAL | 0 refills | Status: AC | PRN

## 2023-01-08 MED ORDER — POTASSIUM CHLORIDE CRYS ER 20 MEQ PO TBCR
40.0000 meq | EXTENDED_RELEASE_TABLET | Freq: Once | ORAL | Status: AC
  Administered 2023-01-08: 40 meq via ORAL
  Filled 2023-01-08: qty 2

## 2023-01-08 MED ORDER — HYDROCODONE-ACETAMINOPHEN 5-325 MG PO TABS: 1.0000 | ORAL_TABLET | ORAL | 0 refills | Status: AC | PRN

## 2023-01-08 MED ORDER — SENNA 8.6 MG PO TABS
2.0000 | ORAL_TABLET | Freq: Every day | ORAL | 0 refills | Status: AC
Start: 2023-01-08 — End: 2023-01-23

## 2023-01-08 MED ORDER — DOCUSATE SODIUM 100 MG PO CAPS: 100.0000 mg | ORAL_CAPSULE | Freq: Two times a day (BID) | ORAL | 0 refills | Status: AC

## 2023-01-08 MED ORDER — POLYETHYLENE GLYCOL 3350 17 G PO PACK
17.0000 g | PACK | Freq: Every day | ORAL | 0 refills | Status: AC | PRN
Start: 2023-01-08 — End: 2023-02-07

## 2023-01-08 NOTE — Discharge Summary (Signed)
Kristen Orr EXB:[id number] DOB: Mar 18, 1946 DOA: 01/06/2023  PCP: Carylon Perches, MD  Admit date: 01/06/2023 Discharge date: 01/08/2023  Time spent: 35 minutes  Recommendations for Outpatient Follow-up:  Pcp and ortho f/u Referred to outpatient PT  BMP at pcp f/u, consider holding chlorthalidone assuming sodium and potassium improve Consider resuming osteoporosis treatment    Discharge Diagnoses:  Principal Problem:   Hip fracture (HCC) Active Problems:   HTN (hypertension)   HLD (hyperlipidemia)   Hypokalemia   Hyponatremia   Discharge Condition: stable  Diet recommendation: heart healthy  Filed Weights   01/06/23 1628 01/07/23 0717  Weight: 64.4 kg 64.4 kg    History of present illness:  From admission h and p Kristen Orr is a 77 y.o. female with medical history significant of HTN, HLD, depression who presents with right hip pain after tripping over vacuum at home.   Had a vacuum leaned up against kitchen counter. Pt didn't see it and tripped over it landing on right hip. Did not hit head. No prior chest pain, palpitation, dizziness or lightheadedness. Recently just had right knee replacement in March. No on any anticoagulation.     On arrival to the ED, she was afebrile, blood pressure 146/86 on room air.   CBC with leukocytosis of 15.3 K.   BMP with mild hyponatremia of 131, potassium of 3.1, glucose of 90.   Hip x-ray demonstrated displaced right femoral neck fracture.   Orthopedic Dr. Linna Caprice was consulted and recommended right total hip arthroplasty tomorrow.  Hospital Course:  Patient presents after trip and fall at home with displaced right femoral neck fracture which was repaired by orthopedics on 01/07/23 with total right hip arthroplasty, anterior approach. Pain is controlled, tolerating diet, voiding spontaneously, passing flatus. PT advises outpt PT which we have ordered. Ortho advises aspirin for dvt ppx which they have ordered. Patient says she  was previously treated for osteoporosis but has been off meds for some time - worth considering a re-start as this is a fragility fracture. Here sodium and potassium both noted to be mildly low, possibly 2/2 chlorthalidone so advise holding that at discharge. Home irbesartan aldo held as patient normotensive here but this can be resumed as an outpatient.    Procedures: See above   Consultations: orthopedics  Discharge Exam: Vitals:   01/08/23 0110 01/08/23 0739  BP: 118/73 120/66  Pulse: 90 86  Resp: 18 18  Temp: 97.7 F (36.5 C) 98.3 F (36.8 C)  SpO2: 94% 94%    General: NAD Cardiovascular: RRR Respiratory: CTAB Ext: right hip bandage c/d/I. Warm extremities.  Discharge Instructions   Discharge Instructions     Diet - low sodium heart healthy   Complete by: As directed    Increase activity slowly   Complete by: As directed       Allergies as of 01/08/2023       Reactions   Tape Hives   Egg-derived Products    Penicillins Rash, Hives, Other (See Comments)        Medication List     STOP taking these medications    chlorthalidone 25 MG tablet Commonly known as: HYGROTON   irbesartan 150 MG tablet Commonly known as: AVAPRO       TAKE these medications    aspirin 81 MG chewable tablet Commonly known as: Aspirin Childrens Chew 1 tablet (81 mg total) by mouth 2 (two) times daily with a meal.   citalopram 20 MG tablet Commonly known as: CELEXA Take 20 mg  by mouth daily.   docusate sodium 100 MG capsule Commonly known as: Colace Take 1 capsule (100 mg total) by mouth 2 (two) times daily.   ezetimibe 10 MG tablet Commonly known as: ZETIA   fexofenadine 180 MG tablet Commonly known as: ALLEGRA Take 180 mg by mouth daily as needed for allergies or rhinitis.   HYDROcodone-acetaminophen 5-325 MG tablet Commonly known as: NORCO/VICODIN Take 1 tablet by mouth every 4 (four) hours as needed for up to 7 days for moderate pain (pain score 4-6) or  severe pain (pain score 7-10).   ibuprofen 200 MG tablet Commonly known as: ADVIL Take 400 mg by mouth once as needed for moderate pain (pain score 4-6).   methocarbamol 500 MG tablet Commonly known as: ROBAXIN Take 1 tablet (500 mg total) by mouth every 6 (six) hours as needed for muscle spasms.   ondansetron 4 MG tablet Commonly known as: Zofran Take 1 tablet (4 mg total) by mouth every 8 (eight) hours as needed for nausea or vomiting.   polyethylene glycol 17 g packet Commonly known as: MiraLax Take 17 g by mouth daily as needed for mild constipation or moderate constipation.   senna 8.6 MG Tabs tablet Commonly known as: SENOKOT Take 2 tablets (17.2 mg total) by mouth at bedtime for 15 days.   Vagifem 10 MCG Tabs vaginal tablet Generic drug: Estradiol Take 1 tablet by mouth 2 (two) times a week.   VITAMIN B COMPLEX PO Take 1 tablet by mouth daily.   VITAMIN C PO Take 1 tablet by mouth 2 (two) times daily.       Allergies  Allergen Reactions   Tape Hives   Egg-Derived Products    Penicillins Rash, Hives and Other (See Comments)    Follow-up Information     Clois Dupes, PA-C. Schedule an appointment as soon as possible for a visit in 2 week(s).   Specialty: Orthopedic Surgery Why: For suture removal, For wound re-check Contact information: [address], [address] [place] [address] [phone]         Carylon Perches, MD Follow up.   Specialty: Internal Medicine Contact information: [address] [place] [address] [phone]                  The results of significant diagnostics from this hospitalization (including imaging, microbiology, ancillary and laboratory) are listed below for reference.    Significant Diagnostic Studies: DG Pelvis Portable  Result Date: 01/08/2023 CLINICAL DATA:  Right hip fracture EXAM: PORTABLE PELVIS 1-2 VIEWS COMPARISON:  None Available. FINDINGS: Single view radiograph of the pelvis  demonstrates surgical changes of right total hip arthroplasty with arthroplasty components overlying the expected position. Normal alignment. No unexpected fracture or dislocation. Pelvis and visualized left hip are intact. Subcutaneous gas noted within the soft tissues lateral to the right hip, likely postsurgical in nature. IMPRESSION: 1. Right total hip arthroplasty. No unexpected fracture or dislocation. Electronically Signed   By: Helyn Numbers M.D.   On: 01/08/2023 00:04   DG HIP UNILAT WITH PELVIS 1V RIGHT  Result Date: 01/07/2023 CLINICAL DATA:  Elective surgery. EXAM: DG HIP (WITH OR WITHOUT PELVIS) 1V RIGHT COMPARISON:  Preoperative imaging FINDINGS: Four fluoroscopic spot views of the pelvis and right hip obtained in the operating room. Sequential images during hip arthroplasty. Fluoroscopy time 20 seconds. Dose 2.43 mGy. IMPRESSION: Intraoperative fluoroscopy for right hip arthroplasty. Electronically Signed   By: Narda Rutherford M.D.   On: 01/07/2023 11:14  DG C-Arm 1-60 Min-No Report  Result Date: 01/07/2023 Fluoroscopy was utilized by the requesting physician.  No radiographic interpretation.   DG C-Arm 1-60 Min-No Report  Result Date: 01/07/2023 Fluoroscopy was utilized by the requesting physician.  No radiographic interpretation.   DG Knee Right Port  Result Date: 01/06/2023 CLINICAL DATA:  Known right femoral neck fracture knee pain, initial encounter EXAM: PORTABLE RIGHT KNEE - 2 VIEW COMPARISON:  None Available. FINDINGS: Right knee replacement is noted. No acute bony abnormality is noted. IMPRESSION: No acute abnormality noted. Electronically Signed   By: Alcide Clever M.D.   On: 01/06/2023 21:53   DG Hip Unilat W or Wo Pelvis 2-3 Views Right  Result Date: 01/06/2023 CLINICAL DATA:  Right hip pain after fall, unable to bear weight. EXAM: DG HIP (WITH OR WITHOUT PELVIS) 2-3V RIGHT COMPARISON:  None Available. FINDINGS: Displaced fracture of the femoral neck. Slight  medial displacement of the distal femur. The femoral head remains seated in the acetabulum. No hip dislocation. No additional pelvis of pelvic ring. Pubic rami are intact. No pubic symphyseal or sacroiliac diastasis. IMPRESSION: Displaced right femoral neck fracture. Electronically Signed   By: Narda Rutherford M.D.   On: 01/06/2023 18:06    Microbiology: Recent Results (from the past 240 hour(s))  Surgical pcr screen     Status: None   Collection Time: 01/07/23  5:42 AM   Specimen: Nasal Mucosa; Nasal Swab  Result Value Ref Range Status   MRSA, PCR NEGATIVE NEGATIVE Final   Staphylococcus aureus NEGATIVE NEGATIVE Final    Comment: (NOTE) The Xpert SA Assay (FDA approved for NASAL specimens in patients 59 years of age and older), is one component of a comprehensive surveillance program. It is not intended to diagnose infection nor to guide or monitor treatment. Performed at [facility], [address], [place], [place] [address]      Labs: Basic Metabolic Panel: Recent Labs  Lab 01/06/23 1708 01/07/23 1608 01/08/23 0836  NA 131* 132* 130*  K 3.1* 3.6 3.3*  CL 95* 97* 96*  CO2 24 24 26   GLUCOSE 90 173* 109*  BUN 23 17 16   CREATININE 0.74 0.99 0.92  CALCIUM 9.0 8.3* 8.2*  MG  --  1.7 1.8  PHOS  --  3.4 3.2   Liver Function Tests: Recent Labs  Lab 01/07/23 1608 01/08/23 0836  ALBUMIN 3.3* 3.0*   No results for input(s): "LIPASE", "AMYLASE" in the last 168 hours. No results for input(s): "AMMONIA" in the last 168 hours. CBC: Recent Labs  Lab 01/06/23 1708 01/07/23 1608 01/08/23 0836  WBC 15.3* 15.0* 12.0*  NEUTROABS 12.7*  --   --   HGB 13.3 11.0* 9.4*  HCT 39.8 33.5* 28.4*  MCV 88.8 90.1 90.4  PLT 294 265 215   Cardiac Enzymes: No results for input(s): "CKTOTAL", "CKMB", "CKMBINDEX", "TROPONINI" in the last 168 hours. BNP: BNP (last 3 results) No results for input(s): "BNP" in the last 8760 hours.  ProBNP (last 3 results) No results for input(s):  "PROBNP" in the last 8760 hours.  CBG: No results for input(s): "GLUCAP" in the last 168 hours.     Signed:  Silvano Bilis MD.  [facility] 01/08/2023, 1:55 PM

## 2023-01-08 NOTE — TOC Transition Note (Signed)
Transition of Care [facility]) - CM/SW Discharge Note   Patient Details  Name: Kristen Orr MRN: [id number] Date of Birth: 22-Jun-1945  [facility]) CM/SW Contact:  Epifanio Lesches, RN Phone Number: 01/08/2023, 3:03 PM   Clinical Narrative:    Patient will DC to: home Anticipated DC date: 01/08/2023 Family notified: husband Transport by: car           - S/P Right total hip arthroplasty   Per MD patient ready for DC today . RN, patient, and patient's husband notified of DC. Faxed outpt PT referral to  [facility] , [phone]. Pt without RX med concerns. Post hospital f/u noted on AVS. Pt without DME needs. Husband to provide transportation to home.  RNCM will sign off for now as intervention is no longer needed. Please consult [place] again if new needs arise.     Final next level of care: Home/Self Care Barriers to Discharge: No Barriers Identified   Patient Goals and CMS Choice      Discharge Placement                         Discharge Plan and Services Additional resources added to the After Visit Summary for                                       Social Determinants of Health (SDOH) Interventions SDOH Screenings   Food Insecurity: No Food Insecurity (01/06/2023)  Housing: Low Risk  (01/06/2023)  Transportation Needs: No Transportation Needs (01/06/2023)  Utilities: At Risk (01/06/2023)  Social Connections: Unknown (08/01/2021)   Received from [facility]  Tobacco Use: Medium Risk (01/07/2023)     Readmission Risk Interventions     No data to display

## 2023-01-08 NOTE — Progress Notes (Signed)
    Subjective:  Patient reports pain as mild to moderate.  Denies N/V/CP/SOB/Abd pain. Denies any tingling or numbness in LE bilaterally. She is eager for d/c home. Catheter removed this am.   Objective:   VITALS:   Vitals:   01/07/23 1541 01/07/23 1957 01/08/23 0110 01/08/23 0739  BP: 113/68 125/77 118/73 120/66  Pulse: 98 (!) 103 90 86  Resp: 18 18 18 18   Temp: 98.2 F (36.8 C) 98.4 F (36.9 C) 97.7 F (36.5 C) 98.3 F (36.8 C)  TempSrc: Oral Oral Oral Oral  SpO2: 92% 92% 94% 94%  Weight:      Height:        Patient sitting up in bed. NAD.  Neurologically intact ABD soft Neurovascular intact Sensation intact distally Intact pulses distally Dorsiflexion/Plantar flexion intact Incision: dressing C/D/I No cellulitis present Compartment soft   Lab Results  Component Value Date   WBC 15.0 (H) 01/07/2023   HGB 11.0 (L) 01/07/2023   HCT 33.5 (L) 01/07/2023   MCV 90.1 01/07/2023   PLT 265 01/07/2023   BMET    Component Value Date/Time   NA 132 (L) 01/07/2023 1608   K 3.6 01/07/2023 1608   CL 97 (L) 01/07/2023 1608   CO2 24 01/07/2023 1608   GLUCOSE 173 (H) 01/07/2023 1608   BUN 17 01/07/2023 1608   CREATININE 0.99 01/07/2023 1608   CALCIUM 8.3 (L) 01/07/2023 1608   GFRNONAA 59 (L) 01/07/2023 1608     Assessment/Plan: 1 Day Post-Op   Principal Problem:   Hip fracture (HCC) Active Problems:   HTN (hypertension)   HLD (hyperlipidemia)   Hypokalemia   Hyponatremia   WBAT with walker DVT ppx: Aspirin, SCDs, TEDS PO pain control PT/OT: PT has not seen yet. PT to come by today.  Dispo:  - Patient under care of the medical team, disposition per their recommendation. Patient eager for d/c home this date. - Pain medication and DVT ppx sent to pharmacy.    Clois Dupes, PA-C 01/08/2023, 8:14 AM   [facility]  [facility] [address], [address], [place], [place] [address] Phone: [phone] [email or web address] Facebook   [employer]

## 2023-01-08 NOTE — Plan of Care (Signed)
  Problem: Education: Goal: Knowledge of the prescribed therapeutic regimen will improve Outcome: Progressing   Problem: Activity: Goal: Ability to avoid complications of mobility impairment will improve Outcome: Progressing   Problem: Clinical Measurements: Goal: Postoperative complications will be avoided or minimized Outcome: Progressing   Problem: Pain Management: Goal: Pain level will decrease with appropriate interventions Outcome: Progressing   Problem: Skin Integrity: Goal: Will show signs of wound healing Outcome: Progressing   Problem: Education: Goal: Knowledge of the prescribed therapeutic regimen will improve Outcome: Progressing   Problem: Activity: Goal: Ability to avoid complications of mobility impairment will improve Outcome: Progressing   Problem: Clinical Measurements: Goal: Postoperative complications will be avoided or minimized Outcome: Progressing   Problem: Pain Management: Goal: Pain level will decrease with appropriate interventions Outcome: Progressing   Problem: Skin Integrity: Goal: Will show signs of wound healing Outcome: Progressing

## 2023-01-08 NOTE — Evaluation (Signed)
Physical Therapy Evaluation & Discharge Patient Details Name: Kristen Orr MRN: [id number] DOB: 1945/04/28 Today's Date: 01/08/2023  History of Present Illness  The pt is a 77 yo female presenting 10/19 after a fall at home. Pt found to have R femoral neck fx s/p R THA on 10/20. PMH includes: R TKA in march of 2024, HTN, HLD, and depression.   Clinical Impression  Pt in bed upon arrival of PT, agreeable to evaluation at this time. Prior to admission the pt was completely independent without use of DME for mobility, is active outside of the home, and hopeful to return to playing pickleball. The pt now presents with minor limitations in functional mobility, strength, and dynamic stability due to above dx, and will continue to benefit from skilled PT to address these deficits. She has limited strength in R hip for abd/adduction as well as hip flexion, but reports sensation intact in BLE. The pt was able to complete bed mobility, sit-stand transfers, and hallway ambulation with use of RW at a supervision level, and completed navigation of stairs with bilateral rails and CGA. The pt has all needed DME and support from family available after d/c. Pt and spouse educated on progressive activity to maintain mobility while she awaits OPPT for strengthening and return to sport training. Pt and her spouse agreeable to all education, safe for d/c home when medically stable. No further acute PT needs, thank you for the consult.          If plan is discharge home, recommend the following: Help with stairs or ramp for entrance     Equipment Recommendations None recommended by PT  Recommendations for Other Services       Functional Status Assessment Patient has had a recent decline in their functional status and demonstrates the ability to make significant improvements in function in a reasonable and predictable amount of time.     Precautions / Restrictions Precautions Precautions: Fall Precaution  Comments: low fall Restrictions Weight Bearing Restrictions: Yes RLE Weight Bearing: Weight bearing as tolerated      Mobility  Bed Mobility Overal bed mobility: Independent                  Transfers Overall transfer level: Needs assistance Equipment used: None Transfers: Sit to/from Stand Sit to Stand: Contact guard assist           General transfer comment: stable without DME    Ambulation/Gait Ambulation/Gait assistance: Contact guard assist, Supervision Gait Distance (Feet): 250 Feet Assistive device: Rolling walker (2 wheels), None Gait Pattern/deviations: Step-through pattern, Decreased stride length Gait velocity: decreased Gait velocity interpretation: <1.31 ft/sec, indicative of household ambulator   General Gait Details: pt with slowed gait without overt LOB or buckling. significant improvement in speed and stability with BUE support on RW.  Stairs Stairs: Yes Stairs assistance: Contact guard assist Stair Management: Two rails, Alternating pattern, Forwards Number of Stairs: 2        Balance Overall balance assessment: Mild deficits observed, not formally tested                                           Pertinent Vitals/Pain Pain Assessment Pain Assessment: Faces Faces Pain Scale: Hurts a little bit Pain Location: R hip Pain Descriptors / Indicators: Discomfort, Dull Pain Intervention(s): Limited activity within patient's tolerance, Monitored during session, Repositioned, Premedicated before session  Home Living Family/patient expects to be discharged to:: Private residence Living Arrangements: Spouse/significant other Available Help at Discharge: Family Type of Home: House Home Access: Stairs to enter Entrance Stairs-Rails: Right;Left;Can reach both Entrance Stairs-Number of Steps: 4 Alternate Level Stairs-Number of Steps: 12, but has stair lift Home Layout: Two level;Bed/bath upstairs Home Equipment: Rolling  Walker (2 wheels);Cane - single point;BSC/3in1;Shower seat;Toilet riser;Grab bars - toilet;Grab bars - tub/shower;Hand held shower head      Prior Function Prior Level of Function : Independent/Modified Independent;Driving             Mobility Comments: pt had returned to full independence after R TKA earlier this year. no use of DME. no other falls. ADLs Comments: independent     Extremity/Trunk Assessment   Upper Extremity Assessment Upper Extremity Assessment: Overall WFL for tasks assessed    Lower Extremity Assessment Lower Extremity Assessment: RLE deficits/detail RLE Deficits / Details: 5/5 at ankle, 4/5 at knee, 3+/5 at hip. pt with good ROM at R knee, but reports she still has pain in knee with stairs after her TKA RLE Sensation: WNL RLE Coordination: WNL    Cervical / Trunk Assessment Cervical / Trunk Assessment: Normal  Communication   Communication Communication: No apparent difficulties Cueing Techniques: Verbal cues  Cognition Arousal: Alert Behavior During Therapy: WFL for tasks assessed/performed Overall Cognitive Status: Within Functional Limits for tasks assessed                                          General Comments General comments (skin integrity, edema, etc.): VSS on RA        Assessment/Plan    PT Assessment All further PT needs can be met in the next venue of care  PT Problem List Decreased strength;Decreased range of motion;Decreased activity tolerance;Decreased balance;Decreased mobility;Decreased coordination;Decreased cognition;Decreased safety awareness       PT Treatment Interventions DME instruction;Gait training;Stair training;Functional mobility training;Therapeutic activities;Balance training;Therapeutic exercise    PT Goals (Current goals can be found in the Care Plan section)  Acute Rehab PT Goals Patient Stated Goal: return home, progress to playing pickleball PT Goal Formulation: All assessment and  education complete, DC therapy Time For Goal Achievement: 01/15/23 Potential to Achieve Goals: Good     AM-PAC PT "6 Clicks" Mobility  Outcome Measure Help needed turning from your back to your side while in a flat bed without using bedrails?: None Help needed moving from lying on your back to sitting on the side of a flat bed without using bedrails?: None Help needed moving to and from a bed to a chair (including a wheelchair)?: A Little Help needed standing up from a chair using your arms (e.g., wheelchair or bedside chair)?: A Little Help needed to walk in hospital room?: A Little Help needed climbing 3-5 steps with a railing? : A Little 6 Click Score: 20    End of Session Equipment Utilized During Treatment: Gait belt Activity Tolerance: Patient tolerated treatment well Patient left: in bed;with call bell/phone within reach;with family/visitor present Nurse Communication: Mobility status PT Visit Diagnosis: Unsteadiness on feet (R26.81);Other abnormalities of gait and mobility (R26.89);Muscle weakness (generalized) (M62.81)    Time: 1610-9604 PT Time Calculation (min) (ACUTE ONLY): 30 min   Charges:   PT Evaluation $PT Eval Low Complexity: 1 Low   PT General Charges $$ ACUTE PT VISIT: 1 Visit  Vickki Muff, PT, DPT   Acute Rehabilitation Department Office [phone] Secure Chat Communication Preferred  Ronnie Derby 01/08/2023, 1:24 PM

## 2023-01-09 ENCOUNTER — Encounter (HOSPITAL_COMMUNITY): Payer: Self-pay | Admitting: Orthopedic Surgery

## 2023-01-22 DIAGNOSIS — Z96641 Presence of right artificial hip joint: Secondary | ICD-10-CM | POA: Diagnosis not present

## 2023-01-22 DIAGNOSIS — Z471 Aftercare following joint replacement surgery: Secondary | ICD-10-CM | POA: Diagnosis not present

## 2023-01-25 DIAGNOSIS — L814 Other melanin hyperpigmentation: Secondary | ICD-10-CM | POA: Diagnosis not present

## 2023-01-25 DIAGNOSIS — D2271 Melanocytic nevi of right lower limb, including hip: Secondary | ICD-10-CM | POA: Diagnosis not present

## 2023-01-25 DIAGNOSIS — D225 Melanocytic nevi of trunk: Secondary | ICD-10-CM | POA: Diagnosis not present

## 2023-01-25 DIAGNOSIS — L821 Other seborrheic keratosis: Secondary | ICD-10-CM | POA: Diagnosis not present

## 2023-01-25 DIAGNOSIS — D2262 Melanocytic nevi of left upper limb, including shoulder: Secondary | ICD-10-CM | POA: Diagnosis not present

## 2023-02-06 DIAGNOSIS — Z79899 Other long term (current) drug therapy: Secondary | ICD-10-CM | POA: Diagnosis not present

## 2023-02-06 DIAGNOSIS — I10 Essential (primary) hypertension: Secondary | ICD-10-CM | POA: Diagnosis not present

## 2023-02-19 DIAGNOSIS — H524 Presbyopia: Secondary | ICD-10-CM | POA: Diagnosis not present

## 2023-02-19 DIAGNOSIS — H25813 Combined forms of age-related cataract, bilateral: Secondary | ICD-10-CM | POA: Diagnosis not present

## 2023-02-19 DIAGNOSIS — H35373 Puckering of macula, bilateral: Secondary | ICD-10-CM | POA: Diagnosis not present

## 2023-02-19 DIAGNOSIS — H43813 Vitreous degeneration, bilateral: Secondary | ICD-10-CM | POA: Diagnosis not present

## 2023-02-20 DIAGNOSIS — S72031D Displaced midcervical fracture of right femur, subsequent encounter for closed fracture with routine healing: Secondary | ICD-10-CM | POA: Diagnosis not present

## 2023-02-20 DIAGNOSIS — Z96641 Presence of right artificial hip joint: Secondary | ICD-10-CM | POA: Diagnosis not present

## 2023-02-20 DIAGNOSIS — Z471 Aftercare following joint replacement surgery: Secondary | ICD-10-CM | POA: Diagnosis not present

## 2023-05-07 ENCOUNTER — Other Ambulatory Visit (HOSPITAL_COMMUNITY): Payer: Self-pay | Admitting: Internal Medicine

## 2023-05-07 DIAGNOSIS — Z1231 Encounter for screening mammogram for malignant neoplasm of breast: Secondary | ICD-10-CM

## 2023-05-16 ENCOUNTER — Ambulatory Visit (HOSPITAL_COMMUNITY)
Admission: RE | Admit: 2023-05-16 | Discharge: 2023-05-16 | Disposition: A | Discharge status: Home / Self Care | Payer: PPO | Source: Ambulatory Visit | Attending: Internal Medicine | Admitting: Internal Medicine

## 2023-05-16 DIAGNOSIS — Z1231 Encounter for screening mammogram for malignant neoplasm of breast: Secondary | ICD-10-CM | POA: Diagnosis present

## 2023-05-21 ENCOUNTER — Other Ambulatory Visit (HOSPITAL_COMMUNITY): Payer: Self-pay | Admitting: Internal Medicine

## 2023-05-21 DIAGNOSIS — R928 Other abnormal and inconclusive findings on diagnostic imaging of breast: Secondary | ICD-10-CM

## 2023-05-25 DIAGNOSIS — Z86018 Personal history of other benign neoplasm: Secondary | ICD-10-CM | POA: Diagnosis not present

## 2023-05-25 DIAGNOSIS — I10 Essential (primary) hypertension: Secondary | ICD-10-CM | POA: Diagnosis not present

## 2023-05-25 DIAGNOSIS — F329 Major depressive disorder, single episode, unspecified: Secondary | ICD-10-CM | POA: Diagnosis not present

## 2023-05-25 DIAGNOSIS — Z79899 Other long term (current) drug therapy: Secondary | ICD-10-CM | POA: Diagnosis not present

## 2023-06-11 ENCOUNTER — Other Ambulatory Visit (HOSPITAL_COMMUNITY): Payer: Self-pay

## 2023-06-14 ENCOUNTER — Ambulatory Visit (HOSPITAL_COMMUNITY)
Admission: RE | Admit: 2023-06-14 | Discharge: 2023-06-14 | Disposition: A | Discharge status: Home / Self Care | Payer: Self-pay | Source: Ambulatory Visit | Attending: Internal Medicine | Admitting: Internal Medicine

## 2023-06-14 ENCOUNTER — Ambulatory Visit (HOSPITAL_COMMUNITY)
Admission: RE | Admit: 2023-06-14 | Discharge: 2023-06-14 | Disposition: A | Discharge status: Home / Self Care | Source: Ambulatory Visit | Attending: Internal Medicine | Admitting: Internal Medicine

## 2023-06-14 ENCOUNTER — Encounter (HOSPITAL_COMMUNITY): Payer: Self-pay

## 2023-06-14 DIAGNOSIS — R928 Other abnormal and inconclusive findings on diagnostic imaging of breast: Secondary | ICD-10-CM | POA: Diagnosis present

## 2023-06-19 ENCOUNTER — Encounter (HOSPITAL_COMMUNITY): Payer: Self-pay

## 2023-06-19 ENCOUNTER — Other Ambulatory Visit (HOSPITAL_COMMUNITY): Payer: Self-pay

## 2023-06-21 ENCOUNTER — Other Ambulatory Visit (HOSPITAL_COMMUNITY): Payer: Self-pay

## 2023-06-21 MED ORDER — CHLORTHALIDONE 25 MG PO TABS
25.0000 mg | ORAL_TABLET | Freq: Every morning | ORAL | 5 refills | Status: AC
  Filled 2023-06-21: qty 90, 90d supply, fill #0
  Filled 2023-10-22: qty 90, 90d supply, fill #1

## 2023-06-21 MED ORDER — EZETIMIBE 10 MG PO TABS
10.0000 mg | ORAL_TABLET | Freq: Every day | ORAL | 5 refills | Status: AC
  Filled 2023-06-21: qty 90, 90d supply, fill #0
  Filled 2023-09-11: qty 90, 90d supply, fill #1
  Filled 2023-12-25: qty 90, 90d supply, fill #2
  Filled 2024-03-28: qty 90, 90d supply, fill #3

## 2023-07-19 ENCOUNTER — Ambulatory Visit (INDEPENDENT_AMBULATORY_CARE_PROVIDER_SITE_OTHER): Payer: Medicare HMO

## 2023-08-06 ENCOUNTER — Other Ambulatory Visit (HOSPITAL_COMMUNITY): Payer: Self-pay

## 2023-08-06 MED ORDER — IRBESARTAN 150 MG PO TABS
150.0000 mg | ORAL_TABLET | Freq: Every day | ORAL | 5 refills | Status: AC
  Filled 2023-08-06: qty 90, 90d supply, fill #0
  Filled 2023-11-14: qty 90, 90d supply, fill #1

## 2023-08-07 ENCOUNTER — Ambulatory Visit (INDEPENDENT_AMBULATORY_CARE_PROVIDER_SITE_OTHER): Payer: Self-pay | Admitting: Audiology

## 2023-08-07 ENCOUNTER — Ambulatory Visit (INDEPENDENT_AMBULATORY_CARE_PROVIDER_SITE_OTHER): Admitting: Otolaryngology

## 2023-08-07 VITALS — BP 134/80 | HR 78

## 2023-08-07 DIAGNOSIS — H6123 Impacted cerumen, bilateral: Secondary | ICD-10-CM | POA: Diagnosis not present

## 2023-08-07 DIAGNOSIS — H903 Sensorineural hearing loss, bilateral: Secondary | ICD-10-CM

## 2023-08-08 DIAGNOSIS — H6123 Impacted cerumen, bilateral: Secondary | ICD-10-CM | POA: Insufficient documentation

## 2023-08-08 DIAGNOSIS — H903 Sensorineural hearing loss, bilateral: Secondary | ICD-10-CM | POA: Insufficient documentation

## 2023-08-08 NOTE — Progress Notes (Signed)
 Patient ID: Kristen Orr, female   DOB: [birth year], 78 y.o.   MRN: [id number]  Follow-up: Hearing loss  HPI: The patient is a 78 year old female who returns today for her follow-up evaluation.  The patient was last seen in April 2024.  At that time, she was complaining of bilateral hearing difficulty.  She was noted to have bilateral high-frequency sensorineural hearing loss.  She was previously fitted with bilateral hearing aids.  The patient returns today reporting no significant change in her hearing.  However, she has noted clogging sensation in her ears.  Currently she denies any otalgia, otorrhea, or vertigo.  Exam: General: Communicates without difficulty, well nourished, no acute distress. Head: Normocephalic, no evidence injury, no tenderness, facial buttresses intact without stepoff. Face/sinus: No tenderness to palpation and percussion. Facial movement is normal and symmetric. Eyes: PERRL, EOMI. No scleral icterus, conjunctivae clear. Neuro: CN II exam reveals vision grossly intact.  No nystagmus at any point of gaze. Ears: Auricles well formed without lesions.  Bilateral cerumen impaction.  Nose: External evaluation reveals normal support and skin without lesions.  Dorsum is intact.  Anterior rhinoscopy reveals normal mucosa over anterior aspect of inferior turbinates and intact septum.  No purulence noted. Oral:  Oral cavity and oropharynx are intact, symmetric, without erythema or edema.  Mucosa is moist without lesions. Neck: Full range of motion without pain.  There is no significant lymphadenopathy.  No masses palpable.  Thyroid  bed within normal limits to palpation.  Parotid glands and submandibular glands equal bilaterally without mass.  Trachea is midline. Neuro:  CN 2-12 grossly intact.   Procedure: Bilateral cerumen disimpaction Anesthesia: None Description: Under the operating microscope, the cerumen is carefully removed with a combination of cerumen currette, alligator forceps,  and suction catheters.  After the cerumen is removed, the TMs are noted to be normal.  No mass, erythema, or lesions. The patient tolerated the procedure well.   Assessment: 1.  Bilateral cerumen impaction.  After the disimpaction procedure, both tympanic membranes and middle ear spaces are noted to be normal. 2.  Subjectively stable bilateral sensorineural hearing loss.  Plan: 1.  The physical exam findings are reviewed with the patient. 2.  Otomicroscopy with bilateral cerumen disimpaction. 3.  Continue the use of her hearing aids. 4.  The patient will return for reevaluation in 1 year.

## 2023-08-08 NOTE — Progress Notes (Signed)
  [address], [address] [place], [place] [address] [phone]  Hearing Aid Check     Kristen Orr comes for a scheduled appointment for a hearing aid check.     Right Left  Hearing aid manufacturer [employer] Intent 3 miniRITE R IO:[id number] [employer] Intent 3 miniRITE R WU:[id number]  Hearing aid style Receiver in the ear Receiver in the ear  Hearing aid battery rechargeable rechargeable  Receiver 2-85 2-85  Dome/ custom earpiece Open 8mm  Open 8mm  Retention wire    Warranty expiration date 08-11-2025 08-11-2025  Loss and Damage unknown unknown  Initial fitting date 08-02-2022 08-02-2022  Device was fit at: [facility]    Chief complaint: Patient reports needs to have the aids cleaned. She struggles putting the dome back into the receiver. Also mentioned she could only hear the streaming in the right ear.  Actions taken: Inspection of the device and listening check showed that both needed new domes and filters. Both were working well after they were clean. How to change the dome and filter and how to clean the dome everyday was demonstrated.   Services fee: $0 was paid at checkout.  Patient was oriented about [employer]'s consumer line to help whenever bluetooth issues arise.  Recommend: Return for a hearing aid check , as needed. Return for a hearing evaluation and to see an ENT, if concerns with hearing changes arise.    Montez Stryker MARIE LEROUX-MARTINEZ, AUD

## 2023-09-11 ENCOUNTER — Other Ambulatory Visit (HOSPITAL_COMMUNITY): Payer: Self-pay

## 2023-09-11 ENCOUNTER — Other Ambulatory Visit: Payer: Self-pay

## 2023-09-11 MED ORDER — CITALOPRAM HYDROBROMIDE 20 MG PO TABS
20.0000 mg | ORAL_TABLET | Freq: Every day | ORAL | 5 refills | Status: AC
  Filled 2023-09-11: qty 90, 90d supply, fill #0
  Filled 2024-01-09: qty 90, 90d supply, fill #1
  Filled 2024-04-11: qty 90, 90d supply, fill #2

## 2023-09-12 DIAGNOSIS — M25561 Pain in right knee: Secondary | ICD-10-CM | POA: Diagnosis not present

## 2023-09-12 DIAGNOSIS — M25551 Pain in right hip: Secondary | ICD-10-CM | POA: Diagnosis not present

## 2023-10-10 ENCOUNTER — Other Ambulatory Visit (HOSPITAL_COMMUNITY): Payer: Self-pay

## 2023-10-22 ENCOUNTER — Other Ambulatory Visit (HOSPITAL_COMMUNITY): Payer: Self-pay

## 2023-10-25 ENCOUNTER — Other Ambulatory Visit (HOSPITAL_COMMUNITY): Payer: Self-pay

## 2023-11-14 ENCOUNTER — Other Ambulatory Visit (HOSPITAL_COMMUNITY): Payer: Self-pay

## 2023-11-28 DIAGNOSIS — I10 Essential (primary) hypertension: Secondary | ICD-10-CM | POA: Diagnosis not present

## 2023-11-28 DIAGNOSIS — N898 Other specified noninflammatory disorders of vagina: Secondary | ICD-10-CM | POA: Diagnosis not present

## 2023-11-28 DIAGNOSIS — F418 Other specified anxiety disorders: Secondary | ICD-10-CM | POA: Diagnosis not present

## 2023-11-28 DIAGNOSIS — Z23 Encounter for immunization: Secondary | ICD-10-CM | POA: Diagnosis not present

## 2023-11-28 DIAGNOSIS — M810 Age-related osteoporosis without current pathological fracture: Secondary | ICD-10-CM | POA: Diagnosis not present

## 2023-11-28 DIAGNOSIS — Z0000 Encounter for general adult medical examination without abnormal findings: Secondary | ICD-10-CM | POA: Diagnosis not present

## 2023-12-10 DIAGNOSIS — M810 Age-related osteoporosis without current pathological fracture: Secondary | ICD-10-CM | POA: Diagnosis not present

## 2023-12-12 DIAGNOSIS — H5711 Ocular pain, right eye: Secondary | ICD-10-CM | POA: Diagnosis not present

## 2023-12-14 DIAGNOSIS — E876 Hypokalemia: Secondary | ICD-10-CM | POA: Diagnosis not present

## 2023-12-14 DIAGNOSIS — M810 Age-related osteoporosis without current pathological fracture: Secondary | ICD-10-CM | POA: Diagnosis not present

## 2023-12-25 ENCOUNTER — Other Ambulatory Visit: Payer: Self-pay

## 2024-01-01 DIAGNOSIS — F321 Major depressive disorder, single episode, moderate: Secondary | ICD-10-CM | POA: Diagnosis not present

## 2024-01-01 DIAGNOSIS — I10 Essential (primary) hypertension: Secondary | ICD-10-CM | POA: Diagnosis not present

## 2024-01-09 DIAGNOSIS — H903 Sensorineural hearing loss, bilateral: Secondary | ICD-10-CM | POA: Diagnosis not present

## 2024-01-10 ENCOUNTER — Other Ambulatory Visit (HOSPITAL_COMMUNITY): Payer: Self-pay

## 2024-01-11 ENCOUNTER — Other Ambulatory Visit (HOSPITAL_COMMUNITY): Payer: Self-pay

## 2024-01-11 MED ORDER — ESTRADIOL 10 MCG VA TABS
ORAL_TABLET | VAGINAL | 3 refills | Status: AC
  Filled 2024-01-11: qty 24, 84d supply, fill #0

## 2024-01-28 DIAGNOSIS — L821 Other seborrheic keratosis: Secondary | ICD-10-CM | POA: Diagnosis not present

## 2024-01-28 DIAGNOSIS — D2271 Melanocytic nevi of right lower limb, including hip: Secondary | ICD-10-CM | POA: Diagnosis not present

## 2024-01-28 DIAGNOSIS — D2262 Melanocytic nevi of left upper limb, including shoulder: Secondary | ICD-10-CM | POA: Diagnosis not present

## 2024-01-28 DIAGNOSIS — D225 Melanocytic nevi of trunk: Secondary | ICD-10-CM | POA: Diagnosis not present

## 2024-01-28 DIAGNOSIS — D2272 Melanocytic nevi of left lower limb, including hip: Secondary | ICD-10-CM | POA: Diagnosis not present

## 2024-01-28 DIAGNOSIS — L814 Other melanin hyperpigmentation: Secondary | ICD-10-CM | POA: Diagnosis not present

## 2024-01-28 DIAGNOSIS — D2261 Melanocytic nevi of right upper limb, including shoulder: Secondary | ICD-10-CM | POA: Diagnosis not present

## 2024-02-20 DIAGNOSIS — H35373 Puckering of macula, bilateral: Secondary | ICD-10-CM | POA: Diagnosis not present

## 2024-02-20 DIAGNOSIS — H25813 Combined forms of age-related cataract, bilateral: Secondary | ICD-10-CM | POA: Diagnosis not present

## 2024-02-20 DIAGNOSIS — H43813 Vitreous degeneration, bilateral: Secondary | ICD-10-CM | POA: Diagnosis not present

## 2024-03-29 ENCOUNTER — Other Ambulatory Visit (HOSPITAL_COMMUNITY): Payer: Self-pay

## 2024-04-01 ENCOUNTER — Other Ambulatory Visit (HOSPITAL_COMMUNITY): Payer: Self-pay

## 2024-04-11 ENCOUNTER — Other Ambulatory Visit (HOSPITAL_COMMUNITY): Payer: Self-pay
